# Patient Record
Sex: Female | Born: 1955 | Race: White | Hispanic: No | Marital: Single | Smoking: Never smoker
Health system: Southern US, Community
[De-identification: ages and names within clinical notes are randomized; demographics above are authoritative.]

## PROBLEM LIST (undated history)

## (undated) DIAGNOSIS — F79 Unspecified intellectual disabilities: Secondary | ICD-10-CM

## (undated) DIAGNOSIS — J1281 Pneumonia due to SARS-associated coronavirus: Secondary | ICD-10-CM

## (undated) DIAGNOSIS — I10 Essential (primary) hypertension: Secondary | ICD-10-CM

## (undated) DIAGNOSIS — E782 Mixed hyperlipidemia: Secondary | ICD-10-CM

## (undated) HISTORY — DX: Morbid (severe) obesity due to excess calories: E66.01

## (undated) HISTORY — DX: Pneumonia due to SARS-associated coronavirus: J12.81

## (undated) HISTORY — DX: Mixed hyperlipidemia: E78.2

## (undated) HISTORY — DX: Unspecified intellectual disabilities: F79

## (undated) HISTORY — DX: Essential (primary) hypertension: I10

---

## 2004-08-02 ENCOUNTER — Ambulatory Visit: Payer: Self-pay | Admitting: Family Medicine

## 2004-10-22 ENCOUNTER — Ambulatory Visit: Payer: Self-pay | Admitting: Family Medicine

## 2004-11-09 ENCOUNTER — Ambulatory Visit: Payer: Self-pay | Admitting: Family Medicine

## 2005-01-11 ENCOUNTER — Ambulatory Visit: Payer: Self-pay | Admitting: Family Medicine

## 2005-04-04 ENCOUNTER — Ambulatory Visit: Payer: Self-pay | Admitting: Family Medicine

## 2005-06-22 ENCOUNTER — Ambulatory Visit: Payer: Self-pay | Admitting: Family Medicine

## 2005-09-13 ENCOUNTER — Ambulatory Visit: Payer: Self-pay | Admitting: Family Medicine

## 2005-11-04 ENCOUNTER — Ambulatory Visit: Payer: Self-pay | Admitting: Family Medicine

## 2005-11-30 ENCOUNTER — Ambulatory Visit: Payer: Self-pay | Admitting: Family Medicine

## 2011-04-13 LAB — HM COLONOSCOPY

## 2011-11-23 DIAGNOSIS — H4011X0 Primary open-angle glaucoma, stage unspecified: Secondary | ICD-10-CM | POA: Diagnosis not present

## 2011-12-01 DIAGNOSIS — Z79899 Other long term (current) drug therapy: Secondary | ICD-10-CM | POA: Diagnosis not present

## 2011-12-01 DIAGNOSIS — I739 Peripheral vascular disease, unspecified: Secondary | ICD-10-CM | POA: Diagnosis not present

## 2011-12-01 DIAGNOSIS — I10 Essential (primary) hypertension: Secondary | ICD-10-CM | POA: Diagnosis not present

## 2011-12-01 DIAGNOSIS — Z1231 Encounter for screening mammogram for malignant neoplasm of breast: Secondary | ICD-10-CM | POA: Diagnosis not present

## 2011-12-21 DIAGNOSIS — I739 Peripheral vascular disease, unspecified: Secondary | ICD-10-CM | POA: Diagnosis not present

## 2012-01-10 DIAGNOSIS — Z0110 Encounter for examination of ears and hearing without abnormal findings: Secondary | ICD-10-CM | POA: Diagnosis not present

## 2012-01-16 DIAGNOSIS — Z1231 Encounter for screening mammogram for malignant neoplasm of breast: Secondary | ICD-10-CM | POA: Diagnosis not present

## 2012-05-30 DIAGNOSIS — H4011X0 Primary open-angle glaucoma, stage unspecified: Secondary | ICD-10-CM | POA: Diagnosis not present

## 2012-09-26 DIAGNOSIS — R4789 Other speech disturbances: Secondary | ICD-10-CM | POA: Diagnosis not present

## 2012-09-26 DIAGNOSIS — Z111 Encounter for screening for respiratory tuberculosis: Secondary | ICD-10-CM | POA: Diagnosis not present

## 2012-09-26 DIAGNOSIS — I10 Essential (primary) hypertension: Secondary | ICD-10-CM | POA: Diagnosis not present

## 2012-09-26 DIAGNOSIS — Z23 Encounter for immunization: Secondary | ICD-10-CM | POA: Diagnosis not present

## 2012-09-26 DIAGNOSIS — F71 Moderate intellectual disabilities: Secondary | ICD-10-CM | POA: Diagnosis not present

## 2012-11-16 DIAGNOSIS — F73 Profound intellectual disabilities: Secondary | ICD-10-CM | POA: Diagnosis not present

## 2012-11-16 DIAGNOSIS — IMO0001 Reserved for inherently not codable concepts without codable children: Secondary | ICD-10-CM | POA: Diagnosis not present

## 2012-11-16 DIAGNOSIS — F8089 Other developmental disorders of speech and language: Secondary | ICD-10-CM | POA: Diagnosis not present

## 2012-11-20 DIAGNOSIS — F8089 Other developmental disorders of speech and language: Secondary | ICD-10-CM | POA: Diagnosis not present

## 2012-11-20 DIAGNOSIS — F73 Profound intellectual disabilities: Secondary | ICD-10-CM | POA: Diagnosis not present

## 2012-11-20 DIAGNOSIS — IMO0001 Reserved for inherently not codable concepts without codable children: Secondary | ICD-10-CM | POA: Diagnosis not present

## 2012-12-04 DIAGNOSIS — I10 Essential (primary) hypertension: Secondary | ICD-10-CM | POA: Diagnosis not present

## 2012-12-04 DIAGNOSIS — Z0000 Encounter for general adult medical examination without abnormal findings: Secondary | ICD-10-CM | POA: Diagnosis not present

## 2013-01-16 DIAGNOSIS — Z1231 Encounter for screening mammogram for malignant neoplasm of breast: Secondary | ICD-10-CM | POA: Diagnosis not present

## 2013-06-20 DIAGNOSIS — Z23 Encounter for immunization: Secondary | ICD-10-CM | POA: Diagnosis not present

## 2013-06-20 DIAGNOSIS — I10 Essential (primary) hypertension: Secondary | ICD-10-CM | POA: Diagnosis not present

## 2013-06-20 DIAGNOSIS — F71 Moderate intellectual disabilities: Secondary | ICD-10-CM | POA: Diagnosis not present

## 2013-07-01 DIAGNOSIS — S9030XA Contusion of unspecified foot, initial encounter: Secondary | ICD-10-CM | POA: Diagnosis not present

## 2013-07-01 DIAGNOSIS — S93409A Sprain of unspecified ligament of unspecified ankle, initial encounter: Secondary | ICD-10-CM | POA: Diagnosis not present

## 2013-10-03 DIAGNOSIS — R635 Abnormal weight gain: Secondary | ICD-10-CM | POA: Diagnosis not present

## 2014-01-01 DIAGNOSIS — I10 Essential (primary) hypertension: Secondary | ICD-10-CM | POA: Diagnosis not present

## 2014-01-01 DIAGNOSIS — N3000 Acute cystitis without hematuria: Secondary | ICD-10-CM | POA: Diagnosis not present

## 2014-01-01 DIAGNOSIS — Z0000 Encounter for general adult medical examination without abnormal findings: Secondary | ICD-10-CM | POA: Diagnosis not present

## 2014-01-01 DIAGNOSIS — Z111 Encounter for screening for respiratory tuberculosis: Secondary | ICD-10-CM | POA: Diagnosis not present

## 2014-01-17 DIAGNOSIS — Z1231 Encounter for screening mammogram for malignant neoplasm of breast: Secondary | ICD-10-CM | POA: Diagnosis not present

## 2014-07-08 DIAGNOSIS — I10 Essential (primary) hypertension: Secondary | ICD-10-CM | POA: Diagnosis not present

## 2014-07-08 DIAGNOSIS — F71 Moderate intellectual disabilities: Secondary | ICD-10-CM | POA: Diagnosis not present

## 2014-07-08 DIAGNOSIS — Z23 Encounter for immunization: Secondary | ICD-10-CM | POA: Diagnosis not present

## 2014-11-03 DIAGNOSIS — B358 Other dermatophytoses: Secondary | ICD-10-CM | POA: Diagnosis not present

## 2015-01-21 DIAGNOSIS — N3000 Acute cystitis without hematuria: Secondary | ICD-10-CM | POA: Diagnosis not present

## 2015-01-21 DIAGNOSIS — Z0000 Encounter for general adult medical examination without abnormal findings: Secondary | ICD-10-CM | POA: Diagnosis not present

## 2015-01-21 DIAGNOSIS — I10 Essential (primary) hypertension: Secondary | ICD-10-CM | POA: Diagnosis not present

## 2015-02-03 DIAGNOSIS — L219 Seborrheic dermatitis, unspecified: Secondary | ICD-10-CM | POA: Diagnosis not present

## 2015-02-03 DIAGNOSIS — L639 Alopecia areata, unspecified: Secondary | ICD-10-CM | POA: Diagnosis not present

## 2015-02-11 DIAGNOSIS — Z1231 Encounter for screening mammogram for malignant neoplasm of breast: Secondary | ICD-10-CM | POA: Diagnosis not present

## 2015-03-17 DIAGNOSIS — L638 Other alopecia areata: Secondary | ICD-10-CM | POA: Diagnosis not present

## 2015-03-17 DIAGNOSIS — L299 Pruritus, unspecified: Secondary | ICD-10-CM | POA: Diagnosis not present

## 2015-04-28 DIAGNOSIS — L219 Seborrheic dermatitis, unspecified: Secondary | ICD-10-CM | POA: Diagnosis not present

## 2015-05-17 DIAGNOSIS — M84471A Pathological fracture, right ankle, initial encounter for fracture: Secondary | ICD-10-CM | POA: Diagnosis not present

## 2015-05-19 DIAGNOSIS — S8264XA Nondisplaced fracture of lateral malleolus of right fibula, initial encounter for closed fracture: Secondary | ICD-10-CM | POA: Diagnosis not present

## 2015-05-27 DIAGNOSIS — S8264XD Nondisplaced fracture of lateral malleolus of right fibula, subsequent encounter for closed fracture with routine healing: Secondary | ICD-10-CM | POA: Diagnosis not present

## 2015-06-03 DIAGNOSIS — S8264XD Nondisplaced fracture of lateral malleolus of right fibula, subsequent encounter for closed fracture with routine healing: Secondary | ICD-10-CM | POA: Diagnosis not present

## 2015-06-10 DIAGNOSIS — S8264XD Nondisplaced fracture of lateral malleolus of right fibula, subsequent encounter for closed fracture with routine healing: Secondary | ICD-10-CM | POA: Diagnosis not present

## 2015-07-01 DIAGNOSIS — S8264XD Nondisplaced fracture of lateral malleolus of right fibula, subsequent encounter for closed fracture with routine healing: Secondary | ICD-10-CM | POA: Diagnosis not present

## 2015-07-15 DIAGNOSIS — S8002XA Contusion of left knee, initial encounter: Secondary | ICD-10-CM | POA: Diagnosis not present

## 2015-07-15 DIAGNOSIS — S0031XA Abrasion of nose, initial encounter: Secondary | ICD-10-CM | POA: Diagnosis not present

## 2015-07-29 DIAGNOSIS — S8264XD Nondisplaced fracture of lateral malleolus of right fibula, subsequent encounter for closed fracture with routine healing: Secondary | ICD-10-CM | POA: Diagnosis not present

## 2015-09-23 DIAGNOSIS — F71 Moderate intellectual disabilities: Secondary | ICD-10-CM | POA: Diagnosis not present

## 2015-09-23 DIAGNOSIS — I10 Essential (primary) hypertension: Secondary | ICD-10-CM | POA: Diagnosis not present

## 2015-11-03 DIAGNOSIS — J060 Acute laryngopharyngitis: Secondary | ICD-10-CM | POA: Diagnosis not present

## 2015-12-23 DIAGNOSIS — L821 Other seborrheic keratosis: Secondary | ICD-10-CM | POA: Diagnosis not present

## 2016-01-18 DIAGNOSIS — Z6836 Body mass index (BMI) 36.0-36.9, adult: Secondary | ICD-10-CM | POA: Diagnosis not present

## 2016-01-18 DIAGNOSIS — Z0000 Encounter for general adult medical examination without abnormal findings: Secondary | ICD-10-CM | POA: Diagnosis not present

## 2016-02-12 DIAGNOSIS — Z1231 Encounter for screening mammogram for malignant neoplasm of breast: Secondary | ICD-10-CM | POA: Diagnosis not present

## 2016-03-29 DIAGNOSIS — I10 Essential (primary) hypertension: Secondary | ICD-10-CM | POA: Diagnosis not present

## 2016-03-29 DIAGNOSIS — F71 Moderate intellectual disabilities: Secondary | ICD-10-CM | POA: Diagnosis not present

## 2016-07-19 DIAGNOSIS — Z23 Encounter for immunization: Secondary | ICD-10-CM | POA: Diagnosis not present

## 2016-09-14 DIAGNOSIS — I10 Essential (primary) hypertension: Secondary | ICD-10-CM | POA: Diagnosis not present

## 2016-09-14 DIAGNOSIS — Z0000 Encounter for general adult medical examination without abnormal findings: Secondary | ICD-10-CM | POA: Diagnosis not present

## 2016-09-14 DIAGNOSIS — E782 Mixed hyperlipidemia: Secondary | ICD-10-CM | POA: Diagnosis not present

## 2016-09-14 DIAGNOSIS — Z6836 Body mass index (BMI) 36.0-36.9, adult: Secondary | ICD-10-CM | POA: Diagnosis not present

## 2016-09-14 DIAGNOSIS — Z111 Encounter for screening for respiratory tuberculosis: Secondary | ICD-10-CM | POA: Diagnosis not present

## 2017-03-27 DIAGNOSIS — Z1231 Encounter for screening mammogram for malignant neoplasm of breast: Secondary | ICD-10-CM | POA: Diagnosis not present

## 2017-03-27 DIAGNOSIS — F72 Severe intellectual disabilities: Secondary | ICD-10-CM | POA: Diagnosis not present

## 2017-03-27 DIAGNOSIS — E782 Mixed hyperlipidemia: Secondary | ICD-10-CM | POA: Diagnosis not present

## 2017-03-27 DIAGNOSIS — I10 Essential (primary) hypertension: Secondary | ICD-10-CM | POA: Diagnosis not present

## 2017-04-04 DIAGNOSIS — Z1231 Encounter for screening mammogram for malignant neoplasm of breast: Secondary | ICD-10-CM | POA: Diagnosis not present

## 2017-04-05 DIAGNOSIS — D692 Other nonthrombocytopenic purpura: Secondary | ICD-10-CM | POA: Diagnosis not present

## 2017-06-13 DIAGNOSIS — Z23 Encounter for immunization: Secondary | ICD-10-CM | POA: Diagnosis not present

## 2017-08-18 DIAGNOSIS — H2513 Age-related nuclear cataract, bilateral: Secondary | ICD-10-CM | POA: Diagnosis not present

## 2017-08-18 DIAGNOSIS — H40011 Open angle with borderline findings, low risk, right eye: Secondary | ICD-10-CM | POA: Diagnosis not present

## 2017-09-11 DIAGNOSIS — Z6841 Body Mass Index (BMI) 40.0 and over, adult: Secondary | ICD-10-CM | POA: Diagnosis not present

## 2017-09-11 DIAGNOSIS — Z0000 Encounter for general adult medical examination without abnormal findings: Secondary | ICD-10-CM | POA: Diagnosis not present

## 2017-09-27 DIAGNOSIS — E782 Mixed hyperlipidemia: Secondary | ICD-10-CM | POA: Diagnosis not present

## 2017-09-27 DIAGNOSIS — Z111 Encounter for screening for respiratory tuberculosis: Secondary | ICD-10-CM | POA: Diagnosis not present

## 2017-09-27 DIAGNOSIS — I10 Essential (primary) hypertension: Secondary | ICD-10-CM | POA: Diagnosis not present

## 2017-11-08 DIAGNOSIS — J00 Acute nasopharyngitis [common cold]: Secondary | ICD-10-CM | POA: Diagnosis not present

## 2018-01-09 DIAGNOSIS — R600 Localized edema: Secondary | ICD-10-CM | POA: Diagnosis not present

## 2018-01-09 DIAGNOSIS — S93402A Sprain of unspecified ligament of left ankle, initial encounter: Secondary | ICD-10-CM | POA: Diagnosis not present

## 2018-01-11 DIAGNOSIS — M5186 Other intervertebral disc disorders, lumbar region: Secondary | ICD-10-CM | POA: Diagnosis not present

## 2018-01-11 DIAGNOSIS — S3992XA Unspecified injury of lower back, initial encounter: Secondary | ICD-10-CM | POA: Diagnosis not present

## 2018-01-11 DIAGNOSIS — S93402A Sprain of unspecified ligament of left ankle, initial encounter: Secondary | ICD-10-CM | POA: Diagnosis not present

## 2018-01-11 DIAGNOSIS — M79601 Pain in right arm: Secondary | ICD-10-CM | POA: Diagnosis not present

## 2018-01-11 DIAGNOSIS — S99912A Unspecified injury of left ankle, initial encounter: Secondary | ICD-10-CM | POA: Diagnosis not present

## 2018-01-11 DIAGNOSIS — M7989 Other specified soft tissue disorders: Secondary | ICD-10-CM | POA: Diagnosis not present

## 2018-01-16 DIAGNOSIS — S93402A Sprain of unspecified ligament of left ankle, initial encounter: Secondary | ICD-10-CM | POA: Diagnosis not present

## 2018-03-27 DIAGNOSIS — I10 Essential (primary) hypertension: Secondary | ICD-10-CM | POA: Diagnosis not present

## 2018-03-27 DIAGNOSIS — Z1231 Encounter for screening mammogram for malignant neoplasm of breast: Secondary | ICD-10-CM | POA: Diagnosis not present

## 2018-03-27 DIAGNOSIS — E782 Mixed hyperlipidemia: Secondary | ICD-10-CM | POA: Diagnosis not present

## 2018-03-27 DIAGNOSIS — F72 Severe intellectual disabilities: Secondary | ICD-10-CM | POA: Diagnosis not present

## 2018-04-12 DIAGNOSIS — Z1231 Encounter for screening mammogram for malignant neoplasm of breast: Secondary | ICD-10-CM | POA: Diagnosis not present

## 2018-06-06 DIAGNOSIS — R635 Abnormal weight gain: Secondary | ICD-10-CM | POA: Diagnosis not present

## 2018-06-06 DIAGNOSIS — Z23 Encounter for immunization: Secondary | ICD-10-CM | POA: Diagnosis not present

## 2018-06-06 DIAGNOSIS — Z6838 Body mass index (BMI) 38.0-38.9, adult: Secondary | ICD-10-CM | POA: Diagnosis not present

## 2018-07-11 DIAGNOSIS — Z6838 Body mass index (BMI) 38.0-38.9, adult: Secondary | ICD-10-CM | POA: Diagnosis not present

## 2018-07-11 DIAGNOSIS — I10 Essential (primary) hypertension: Secondary | ICD-10-CM | POA: Diagnosis not present

## 2018-09-17 DIAGNOSIS — Z6838 Body mass index (BMI) 38.0-38.9, adult: Secondary | ICD-10-CM | POA: Diagnosis not present

## 2018-09-17 DIAGNOSIS — Z111 Encounter for screening for respiratory tuberculosis: Secondary | ICD-10-CM | POA: Diagnosis not present

## 2018-09-17 DIAGNOSIS — Z0000 Encounter for general adult medical examination without abnormal findings: Secondary | ICD-10-CM | POA: Diagnosis not present

## 2018-09-27 DIAGNOSIS — E782 Mixed hyperlipidemia: Secondary | ICD-10-CM | POA: Diagnosis not present

## 2018-09-27 DIAGNOSIS — Z111 Encounter for screening for respiratory tuberculosis: Secondary | ICD-10-CM | POA: Diagnosis not present

## 2018-09-27 DIAGNOSIS — I10 Essential (primary) hypertension: Secondary | ICD-10-CM | POA: Diagnosis not present

## 2019-06-10 DIAGNOSIS — R42 Dizziness and giddiness: Secondary | ICD-10-CM | POA: Diagnosis not present

## 2019-06-10 DIAGNOSIS — I10 Essential (primary) hypertension: Secondary | ICD-10-CM | POA: Diagnosis not present

## 2019-06-10 DIAGNOSIS — Z1231 Encounter for screening mammogram for malignant neoplasm of breast: Secondary | ICD-10-CM | POA: Diagnosis not present

## 2019-06-10 DIAGNOSIS — E782 Mixed hyperlipidemia: Secondary | ICD-10-CM | POA: Diagnosis not present

## 2019-06-11 ENCOUNTER — Inpatient Hospital Stay (HOSPITAL_COMMUNITY)
Admission: AD | Admit: 2019-06-11 | Discharge: 2019-06-19 | DRG: 177 | Payer: Medicare Other | Source: Other Acute Inpatient Hospital | Attending: Internal Medicine | Admitting: Internal Medicine

## 2019-06-11 DIAGNOSIS — E876 Hypokalemia: Secondary | ICD-10-CM | POA: Diagnosis not present

## 2019-06-11 DIAGNOSIS — R0602 Shortness of breath: Secondary | ICD-10-CM | POA: Diagnosis not present

## 2019-06-11 DIAGNOSIS — M25551 Pain in right hip: Secondary | ICD-10-CM | POA: Diagnosis not present

## 2019-06-11 DIAGNOSIS — R531 Weakness: Secondary | ICD-10-CM | POA: Diagnosis not present

## 2019-06-11 DIAGNOSIS — U071 COVID-19: Secondary | ICD-10-CM | POA: Diagnosis not present

## 2019-06-11 DIAGNOSIS — E7800 Pure hypercholesterolemia, unspecified: Secondary | ICD-10-CM | POA: Diagnosis not present

## 2019-06-11 DIAGNOSIS — R7301 Impaired fasting glucose: Secondary | ICD-10-CM | POA: Diagnosis not present

## 2019-06-11 DIAGNOSIS — E785 Hyperlipidemia, unspecified: Secondary | ICD-10-CM | POA: Diagnosis present

## 2019-06-11 DIAGNOSIS — I10 Essential (primary) hypertension: Secondary | ICD-10-CM | POA: Diagnosis not present

## 2019-06-11 DIAGNOSIS — J181 Lobar pneumonia, unspecified organism: Secondary | ICD-10-CM | POA: Diagnosis not present

## 2019-06-11 DIAGNOSIS — W19XXXA Unspecified fall, initial encounter: Secondary | ICD-10-CM | POA: Diagnosis not present

## 2019-06-11 DIAGNOSIS — F79 Unspecified intellectual disabilities: Secondary | ICD-10-CM | POA: Diagnosis not present

## 2019-06-11 DIAGNOSIS — J1289 Other viral pneumonia: Secondary | ICD-10-CM | POA: Diagnosis not present

## 2019-06-11 DIAGNOSIS — I2510 Atherosclerotic heart disease of native coronary artery without angina pectoris: Secondary | ICD-10-CM | POA: Diagnosis not present

## 2019-06-11 DIAGNOSIS — J1282 Pneumonia due to coronavirus disease 2019: Secondary | ICD-10-CM

## 2019-06-11 DIAGNOSIS — M47819 Spondylosis without myelopathy or radiculopathy, site unspecified: Secondary | ICD-10-CM | POA: Diagnosis not present

## 2019-06-11 DIAGNOSIS — I700 Atherosclerosis of aorta: Secondary | ICD-10-CM | POA: Diagnosis not present

## 2019-06-11 DIAGNOSIS — S79911A Unspecified injury of right hip, initial encounter: Secondary | ICD-10-CM | POA: Diagnosis not present

## 2019-06-11 DIAGNOSIS — R918 Other nonspecific abnormal finding of lung field: Secondary | ICD-10-CM | POA: Diagnosis not present

## 2019-06-11 DIAGNOSIS — F73 Profound intellectual disabilities: Secondary | ICD-10-CM | POA: Diagnosis not present

## 2019-06-11 MED ORDER — ONDANSETRON HCL 4 MG PO TABS: 4.0000 mg | ORAL_TABLET | Freq: Four times a day (QID) | ORAL | Status: DC | PRN

## 2019-06-11 MED ORDER — SODIUM CHLORIDE 0.9% FLUSH
3.0000 mL | Freq: Two times a day (BID) | INTRAVENOUS | Status: DC
  Administered 2019-06-11 – 2019-06-18 (×14): 3 mL via INTRAVENOUS

## 2019-06-11 MED ORDER — VITAMIN C 500 MG PO TABS
500.0000 mg | ORAL_TABLET | Freq: Every day | ORAL | Status: DC
  Administered 2019-06-12 – 2019-06-19 (×8): 500 mg via ORAL
  Filled 2019-06-11 (×8): qty 1

## 2019-06-11 MED ORDER — ENOXAPARIN SODIUM 40 MG/0.4ML ~~LOC~~ SOLN
40.0000 mg | Freq: Every day | SUBCUTANEOUS | Status: DC
  Administered 2019-06-11 – 2019-06-18 (×8): 40 mg via SUBCUTANEOUS
  Filled 2019-06-11 (×8): qty 0.4

## 2019-06-11 MED ORDER — SODIUM CHLORIDE 0.9 % IV SOLN
250.0000 mL | INTRAVENOUS | Status: DC | PRN
  Administered 2019-06-11: 250 mL via INTRAVENOUS

## 2019-06-11 MED ORDER — ONDANSETRON HCL 4 MG/2ML IJ SOLN: 4.0000 mg | Freq: Four times a day (QID) | INTRAMUSCULAR | Status: DC | PRN

## 2019-06-11 MED ORDER — ACETAMINOPHEN 325 MG PO TABS: 650.0000 mg | ORAL_TABLET | Freq: Four times a day (QID) | ORAL | Status: DC | PRN

## 2019-06-11 MED ORDER — ZINC SULFATE 220 (50 ZN) MG PO CAPS
220.0000 mg | ORAL_CAPSULE | Freq: Every day | ORAL | Status: DC
  Administered 2019-06-12 – 2019-06-19 (×8): 220 mg via ORAL
  Filled 2019-06-11 (×9): qty 1

## 2019-06-11 MED ORDER — SODIUM CHLORIDE 0.9% FLUSH: 3.0000 mL | INTRAVENOUS | Status: DC | PRN

## 2019-06-11 MED ORDER — DEXAMETHASONE SODIUM PHOSPHATE 10 MG/ML IJ SOLN
6.0000 mg | INTRAMUSCULAR | Status: DC
  Administered 2019-06-12 – 2019-06-15 (×4): 6 mg via INTRAVENOUS
  Filled 2019-06-11 (×5): qty 1
  Filled 2019-06-11: qty 0.6

## 2019-06-11 MED ORDER — HYDROCOD POLST-CPM POLST ER 10-8 MG/5ML PO SUER: 5.0000 mL | Freq: Two times a day (BID) | ORAL | Status: DC | PRN

## 2019-06-11 MED ORDER — GUAIFENESIN-DM 100-10 MG/5ML PO SYRP
10.0000 mL | ORAL_SOLUTION | ORAL | Status: DC | PRN
  Administered 2019-06-16: 10 mL via ORAL
  Filled 2019-06-11: qty 10

## 2019-06-11 NOTE — H&P (Addendum)
History and Physical    [name]:[id number] DOB: 1956/02/14 DOA: 06/11/2019  PCP: Darrol Jump, PA-C  Patient coming from: snf  Chief Complaint:  Weak, sob  HPI: Lisa Montoya is a 63 y.o. female with medical history significant of mental retardation, htn lives in snf comes in with 2 days of weakness, and dec po intake.  Pt is nonverbal but ambulatory at baseline.  Cannot provide history.  94% on RA.  Vss.  Mildly orthostatic, given ivf and decadron.  Has bilatearl infiltrates on cxr.    Review of Systems:  unobtainable  No past medical history on file. htn ,  intectual disability  Unobtainable histories due to above.     has no history on file for tobacco, alcohol, and drug.  Not on File  No family history on file.  Unknown   Prior to Admission medications   Not on File  unknown  Physical Exam: Vitals:   06/11/19 2300 06/11/19 2311  BP:  118/72  Pulse:  78  Resp:  14  Temp:  98.3 F (36.8 C)  TempSrc:  Oral  SpO2:  92%  Weight: 89.3 kg       Constitutional: NAD, calm, comfortable, nonverbal Vitals:   06/11/19 2300 06/11/19 2311  BP:  118/72  Pulse:  78  Resp:  14  Temp:  98.3 F (36.8 C)  TempSrc:  Oral  SpO2:  92%  Weight: 89.3 kg    Eyes: PERRL, lids and conjunctivae normal ENMT: Mucous membranes are moist. Posterior pharynx clear of any exudate or lesions.Normal dentition.  Neck: normal, supple, no masses, no thyromegaly Respiratory: clear to auscultation bilaterally, no wheezing, no crackles. Normal respiratory effort. No accessory muscle use.  Cardiovascular: Regular rate and rhythm, no murmurs / rubs / gallops. No extremity edema. 2+ pedal pulses. No carotid bruits.  Abdomen: no tenderness, no masses palpated. No hepatosplenomegaly. Bowel sounds positive.  Musculoskeletal: no clubbing / cyanosis. No joint deformity upper and lower extremities. Good ROM, no contractures. Normal muscle tone.  Skin: no rashes, lesions, ulcers. No  induration Neurologic: CN 2-12 grossly intact. Sensation intact, DTR normal. Strength 5/5 in all 4.  Psychiatric:  Normal mood.    Labs on Admission: I have personally reviewed following labs and imaging studies  CBC: No results for input(s): WBC, NEUTROABS, HGB, HCT, MCV, PLT in the last 168 hours. Basic Metabolic Panel: No results for input(s): NA, K, CL, CO2, GLUCOSE, BUN, CREATININE, CALCIUM, MG, PHOS in the last 168 hours. GFR: CrCl cannot be calculated (No successful lab value found.). Liver Function Tests: No results for input(s): AST, ALT, ALKPHOS, BILITOT, PROT, ALBUMIN in the last 168 hours. No results for input(s): LIPASE, AMYLASE in the last 168 hours. No results for input(s): AMMONIA in the last 168 hours. Coagulation Profile: No results for input(s): INR, PROTIME in the last 168 hours. Cardiac Enzymes: No results for input(s): CKTOTAL, CKMB, CKMBINDEX, TROPONINI in the last 168 hours. BNP (last 3 results) No results for input(s): PROBNP in the last 8760 hours. HbA1C: No results for input(s): HGBA1C in the last 72 hours. CBG: No results for input(s): GLUCAP in the last 168 hours. Lipid Profile: No results for input(s): CHOL, HDL, LDLCALC, TRIG, CHOLHDL, LDLDIRECT in the last 72 hours. Thyroid Function Tests: No results for input(s): TSH, T4TOTAL, FREET4, T3FREE, THYROIDAB in the last 72 hours. Anemia Panel: No results for input(s): VITAMINB12, FOLATE, FERRITIN, TIBC, IRON, RETICCTPCT in the last 72 hours. Urine analysis: No results found for:  COLORURINE, APPEARANCEUR, LABSPEC, PHURINE, GLUCOSEU, HGBUR, BILIRUBINUR, KETONESUR, PROTEINUR, UROBILINOGEN, NITRITE, LEUKOCYTESUR Sepsis Labs: !!!!!!!!!!!!!!!!!!!!!!!!!!!!!!!!!!!!!!!!!!!! @LABRCNTIP (procalcitonin:4,lacticidven:4) )No results found for this or any previous visit (from the past 240 hour(s)).   Radiological Exams on Admission: No results found.  Chart from [facility] reviewed   Assessment/Plan 63 yo female  with covid pna  Principal Problem:   Pneumonia due to COVID-19 virus- decadron 6mg  iv daily, remdesivir per pharmacy.  Lovenox/vit c/zinc.  Supplemental oxygen as needed.  Bilateral infiltrates on cxr.    Active Problems:   Intellectual disability- stable    HTN (hypertension)- clarify home meds     DVT prophylaxis: lovenox Code Status:  full Family Communication: none  Disposition Plan:  days Consults called:  none Admission status:  admission   Enslee Bibbins A MD [facility]  If 7PM-7AM, please contact night-coverage [email or web address] Password [email or web address]  06/12/2019, 12:02 AM    Pt seen before midnight  Also at [facility] ED visit:  afebrile. Saturations were 94% on room air. Blood pressure was slightly low at 106/54. She was noted to be orthostatic. Heart rate 74. Afebrile.  WBC 7.2. Hemoglobin 14.8. Platelet count 129. Sodium 134 potassium 3.3 chloride 94 bicarb 34 BUN 17 creatinine 0.9 glucose 126. LFTs include AST of 69 and ALT of 32  COVID-19 test was positive. UA negative. Chest x-ray suggested a mass and so a CT scan of the chest was done which did not show any mass but showed bilateral opacities.  CRP d-dimer ferritin lactic acid and procalcitonin levels are pending.

## 2019-06-12 ENCOUNTER — Inpatient Hospital Stay (HOSPITAL_COMMUNITY): Payer: Medicare Other

## 2019-06-12 DIAGNOSIS — I10 Essential (primary) hypertension: Secondary | ICD-10-CM

## 2019-06-12 DIAGNOSIS — F79 Unspecified intellectual disabilities: Secondary | ICD-10-CM

## 2019-06-12 LAB — CBC WITH DIFFERENTIAL/PLATELET
Abs Immature Granulocytes: 0.02 10*3/uL (ref 0.00–0.07)
Basophils Absolute: 0 10*3/uL (ref 0.0–0.1)
Basophils Relative: 0 %
Eosinophils Absolute: 0 10*3/uL (ref 0.0–0.5)
Eosinophils Relative: 0 %
HCT: 41.2 % (ref 36.0–46.0)
Hemoglobin: 13.7 g/dL (ref 12.0–15.0)
Immature Granulocytes: 0 %
Lymphocytes Relative: 14 %
Lymphs Abs: 0.9 10*3/uL (ref 0.7–4.0)
MCH: 31.4 pg (ref 26.0–34.0)
MCHC: 33.3 g/dL (ref 30.0–36.0)
MCV: 94.3 fL (ref 80.0–100.0)
Monocytes Absolute: 0.2 10*3/uL (ref 0.1–1.0)
Monocytes Relative: 3 %
Neutro Abs: 5.5 10*3/uL (ref 1.7–7.7)
Neutrophils Relative %: 83 %
Platelets: 145 10*3/uL — ABNORMAL LOW (ref 150–400)
RBC: 4.37 MIL/uL (ref 3.87–5.11)
RDW: 13 % (ref 11.5–15.5)
WBC: 6.6 10*3/uL (ref 4.0–10.5)
nRBC: 0 % (ref 0.0–0.2)

## 2019-06-12 LAB — COMPREHENSIVE METABOLIC PANEL
ALT: 29 U/L (ref 0–44)
AST: 50 U/L — ABNORMAL HIGH (ref 15–41)
Albumin: 3.2 g/dL — ABNORMAL LOW (ref 3.5–5.0)
Alkaline Phosphatase: 42 U/L (ref 38–126)
Anion gap: 16 — ABNORMAL HIGH (ref 5–15)
BUN: 14 mg/dL (ref 8–23)
CO2: 25 mmol/L (ref 22–32)
Calcium: 8.4 mg/dL — ABNORMAL LOW (ref 8.9–10.3)
Chloride: 100 mmol/L (ref 98–111)
Creatinine, Ser: 0.53 mg/dL (ref 0.44–1.00)
GFR calc Af Amer: >60 mL/min (ref >60)
GFR calc non Af Amer: >60 mL/min (ref >60)
Glucose, Bld: 132 mg/dL — ABNORMAL HIGH (ref 70–99)
Potassium: 3.3 mmol/L — ABNORMAL LOW (ref 3.5–5.1)
Sodium: 141 mmol/L (ref 135–145)
Total Bilirubin: 1 mg/dL (ref 0.3–1.2)
Total Protein: 6.6 g/dL (ref 6.5–8.1)

## 2019-06-12 LAB — D-DIMER, QUANTITATIVE: D-Dimer, Quant: 1.42 ug/mL-FEU — ABNORMAL HIGH (ref 0.00–0.50)

## 2019-06-12 LAB — ABO/RH: ABO/RH(D): A POS

## 2019-06-12 LAB — C-REACTIVE PROTEIN: CRP: 8.2 mg/dL — ABNORMAL HIGH (ref <1.0)

## 2019-06-12 LAB — HIV ANTIBODY (ROUTINE TESTING W REFLEX): HIV Screen 4th Generation wRfx: NONREACTIVE

## 2019-06-12 MED ORDER — ATORVASTATIN CALCIUM 10 MG PO TABS
10.0000 mg | ORAL_TABLET | Freq: Every day | ORAL | Status: DC
  Administered 2019-06-12 – 2019-06-18 (×7): 10 mg via ORAL
  Filled 2019-06-12 (×7): qty 1

## 2019-06-12 MED ORDER — SODIUM CHLORIDE 0.9 % IV SOLN
100.0000 mg | INTRAVENOUS | Status: AC
  Administered 2019-06-12 – 2019-06-14 (×3): 100 mg via INTRAVENOUS
  Filled 2019-06-12 (×3): qty 20

## 2019-06-12 MED ORDER — BISOPROLOL FUMARATE 5 MG PO TABS
5.0000 mg | ORAL_TABLET | Freq: Every day | ORAL | Status: DC
  Administered 2019-06-12 – 2019-06-19 (×8): 5 mg via ORAL
  Filled 2019-06-12 (×8): qty 1

## 2019-06-12 MED ORDER — ASPIRIN EC 81 MG PO TBEC
81.0000 mg | DELAYED_RELEASE_TABLET | Freq: Every day | ORAL | Status: DC
  Administered 2019-06-12 – 2019-06-19 (×8): 81 mg via ORAL
  Filled 2019-06-12 (×9): qty 1

## 2019-06-12 MED ORDER — POLYVINYL ALCOHOL 1.4 % OP SOLN
1.0000 [drp] | OPHTHALMIC | Status: DC | PRN
  Filled 2019-06-12: qty 15

## 2019-06-12 MED ORDER — TIMOLOL MALEATE 0.5 % OP SOLN
1.0000 [drp] | Freq: Two times a day (BID) | OPHTHALMIC | Status: DC
  Administered 2019-06-12: 2 [drp] via OPHTHALMIC
  Administered 2019-06-13 (×2): 1 [drp] via OPHTHALMIC
  Administered 2019-06-14: 2 [drp] via OPHTHALMIC
  Administered 2019-06-14 – 2019-06-19 (×10): 1 [drp] via OPHTHALMIC
  Filled 2019-06-12: qty 5

## 2019-06-12 MED ORDER — SODIUM CHLORIDE 0.9 % IV SOLN
200.0000 mg | Freq: Once | INTRAVENOUS | Status: AC
  Administered 2019-06-12: 200 mg via INTRAVENOUS
  Filled 2019-06-12: qty 40

## 2019-06-12 MED ORDER — TIMOLOL MALEATE 0.5 % OP SOLN
1.0000 [drp] | Freq: Two times a day (BID) | OPHTHALMIC | Status: DC
  Filled 2019-06-12: qty 5

## 2019-06-12 NOTE — Plan of Care (Signed)
Report was called by Doug Sou from [facility]. The pt has had generalized weakness at the group home but didn't have symptoms of Covid19. Ddimer was elevated so Chest CT was done which showed atypical pneumonia. The pt lives in a group home because she has Mental Retardation. The pt is nonverbal at baseline.

## 2019-06-12 NOTE — Progress Notes (Signed)
Updated patient's group home on patient's condition and plan of care.  Notified by admissions that patient's legal guardian is [place], however there was no answer at the number they provided. Will continue to reach out to legal guardian with updates.

## 2019-06-12 NOTE — Plan of Care (Signed)

## 2019-06-12 NOTE — Progress Notes (Signed)
Pharmacy Brief Note   O:  ALT: 32 ( at [facility])   CXR: Has bilateral infiltrates on cxr per MD note   SpO2: 94% on RA.   A/P:  Patient meets requirements for remdesivir therapy .  Will start  remdesivir 200 mg IV x 1  followed by 100 mg IV daily x 4 days.  Monitor ALT  Royetta Asal, PharmD, BCPS 06/12/2019 12:28 AM

## 2019-06-12 NOTE — Progress Notes (Signed)
PROGRESS NOTE                                                                                                                                                                                                             Patient Demographics:    Lisa Montoya, is a 63 y.o. female, DOB - 02-06-1956, HER:[id number]  Outpatient Primary MD for the patient is Gus Height, PA-C   Admit date - 06/11/2019   LOS - 1  No chief complaint on file.      Brief Narrative: Brief Narrative: Patient is a 63 y.o. female with history of mental retardation, HTN-presented to [facility] for weakness and decreased p.o. intake-further work-up revealed pneumonia secondary to COVID-19.    Subjective:   Subjective: Nonverbal at baseline-but follows commands.  Tracks movement.   Assessment  & Plan :    Covid 19 Viral pneumonia: Remains stable on room air-continue steroids and Remdesivir.  Follow inflammatory markers.  Fever: afebrile/  O2 requirements: On room air  COVID-19 Labs: Recent Labs    06/12/19 0430  DDIMER 1.42*  CRP 8.2*    No results found for: SARSCOV2NAA   COVID-19 Medications: Steroids:10/6>> Remdesivir:10/7>> Actemra:Not indicated Convalescent Plasma:Not indicated Research Studies:N/A  Other medications: Diuretics:Euvolemic-no need for lasix Antibiotics:Not needed as no evidence of bacterial infection  DVT Prophylaxis  :  Lovenox   Hypokalemia: Replete and recheck  HTN: Blood pressure creeping up-resume bisoprolol-continue to hold HCTZ  Dyslipidemia: Continue statin  Mental retardation: Awaiting callback from Lynwood Dawley in facesheet-to see what her usual baseline is.  But per RN-she is nonverbal at baseline.  ABG: No results found for: PHART, PCO2ART, PO2ART, HCO3, TCO2, ACIDBASEDEF, O2SAT  Vent Settings: N/A   Condition -Stabl  Family communication  : Left message for  Delfino Lovett  Code Status :  Full Code  Diet :  Diet Order            Diet Heart Room service appropriate? Yes; Fluid consistency: Thin  Diet effective now               Disposition Plan  :  Remain hospitalized  Barriers to discharge: Needs to complete 5 days of IV Remdesivir  Consults  :  None  Procedures  :  None  Antibiotics  :    Anti-infectives (From admission, onward)  Start     Dose/Rate Route Frequency Ordered Stop   06/12/19 1600  remdesivir 100 mg in sodium chloride 0.9 % 250 mL IVPB     100 mg 500 mL/hr over 30 Minutes Intravenous Every 24 hours 06/12/19 0053 06/16/19 1559   06/12/19 0130  remdesivir 200 mg in sodium chloride 0.9 % 250 mL IVPB     200 mg 500 mL/hr over 30 Minutes Intravenous Once 06/12/19 0053 06/12/19 0215      Inpatient Medications  Scheduled Meds: . dexamethasone (DECADRON) injection  6 mg Intravenous Q24H  . enoxaparin (LOVENOX) injection  40 mg Subcutaneous QHS  . sodium chloride flush  3 mL Intravenous Q12H  . vitamin C  500 mg Oral Daily  . zinc sulfate  220 mg Oral Daily   Continuous Infusions: . sodium chloride 250 mL (06/11/19 2347)  . remdesivir 100 mg in NS 250 mL     PRN Meds:.sodium chloride, acetaminophen, chlorpheniramine-HYDROcodone, guaiFENesin-dextromethorphan, ondansetron **OR** ondansetron (ZOFRAN) IV, sodium chloride flush   Time Spent in minutes  25  See all Orders from today for further details   Oren Binet M.D on 06/12/2019 at 1:56 PM  To page go to [email or web address] - use universal password  [facility] -  Office  [phone]    Objective:   Vitals:   06/11/19 2311 06/11/19 2351 06/12/19 0409 06/12/19 0745  BP: 118/72 118/66 125/68 (!) 130/57  Pulse: 78 78 83 94  Resp: 14 13 (!) 24 16  Temp: 98.3 F (36.8 C)  97.7 F (36.5 C) 98.4 F (36.9 C)  TempSrc: Oral  Oral Oral  SpO2: 92% 94% 94% 97%  Weight:        Wt Readings from Last 3 Encounters:  06/11/19 89.3 kg      Intake/Output Summary (Last 24 hours) at 06/12/2019 1356 Last data filed at 06/12/2019 0700 Gross per 24 hour  Intake 500 ml  Output 100 ml  Net 400 ml     Physical Exam Gen Exam:Alert awake-not in any distress HEENT:atraumatic, normocephalic Chest: B/L clear to auscultation anteriorly CVS:S1S2 regular Abdomen:soft non tender, non distended Extremities:no edema Neurology: Non focal Skin: no rash   Data Review:    CBC Recent Labs  Lab 06/12/19 0430  WBC 6.6  HGB 13.7  HCT 41.2  PLT 145*  MCV 94.3  MCH 31.4  MCHC 33.3  RDW 13.0  LYMPHSABS 0.9  MONOABS 0.2  EOSABS 0.0  BASOSABS 0.0    Chemistries  Recent Labs  Lab 06/12/19 0430  NA 141  K 3.3*  CL 100  CO2 25  GLUCOSE 132*  BUN 14  CREATININE 0.53  CALCIUM 8.4*  AST 50*  ALT 29  ALKPHOS 42  BILITOT 1.0   ------------------------------------------------------------------------------------------------------------------ No results for input(s): CHOL, HDL, LDLCALC, TRIG, CHOLHDL, LDLDIRECT in the last 72 hours.  No results found for: HGBA1C ------------------------------------------------------------------------------------------------------------------ No results for input(s): TSH, T4TOTAL, T3FREE, THYROIDAB in the last 72 hours.  Invalid input(s): FREET3 ------------------------------------------------------------------------------------------------------------------ No results for input(s): VITAMINB12, FOLATE, FERRITIN, TIBC, IRON, RETICCTPCT in the last 72 hours.  Coagulation profile No results for input(s): INR, PROTIME in the last 168 hours.  Recent Labs    06/12/19 0430  DDIMER 1.42*    Cardiac Enzymes No results for input(s): CKMB, TROPONINI, MYOGLOBIN in the last 168 hours.  Invalid input(s): CK ------------------------------------------------------------------------------------------------------------------ No results found for: BNP  Micro Results No results found for this or any  previous visit (from the past 240 hour(s)).  Radiology Reports Dg  Chest Port 1v Same Day  Result Date: 06/12/2019 CLINICAL DATA:  Shortness of breath. EXAM: PORTABLE CHEST 1 VIEW COMPARISON:  June 11, 2019. FINDINGS: Stable cardiomediastinal silhouette. No pneumothorax or pleural effusion is noted. Mild bilateral perihilar opacities are noted concerning for multifocal pneumonia as described on prior CT scan. Bony thorax is unremarkable. IMPRESSION: Bilateral perihilar opacities are noted concerning for multifocal pneumonia as described on prior CT scan. Electronically Signed   By: Lupita RaiderJames  Green Jr M.D.   On: 06/12/2019 10:27

## 2019-06-13 ENCOUNTER — Encounter (HOSPITAL_COMMUNITY): Payer: Self-pay | Admitting: Emergency Medicine

## 2019-06-13 ENCOUNTER — Other Ambulatory Visit: Payer: Self-pay

## 2019-06-13 LAB — CBC WITH DIFFERENTIAL/PLATELET
Abs Immature Granulocytes: 0.07 10*3/uL (ref 0.00–0.07)
Basophils Absolute: 0 10*3/uL (ref 0.0–0.1)
Basophils Relative: 0 %
Eosinophils Absolute: 0 10*3/uL (ref 0.0–0.5)
Eosinophils Relative: 0 %
HCT: 38.1 % (ref 36.0–46.0)
Hemoglobin: 12.7 g/dL (ref 12.0–15.0)
Immature Granulocytes: 1 %
Lymphocytes Relative: 12 %
Lymphs Abs: 0.9 10*3/uL (ref 0.7–4.0)
MCH: 31.3 pg (ref 26.0–34.0)
MCHC: 33.3 g/dL (ref 30.0–36.0)
MCV: 93.8 fL (ref 80.0–100.0)
Monocytes Absolute: 0.6 10*3/uL (ref 0.1–1.0)
Monocytes Relative: 8 %
Neutro Abs: 5.5 10*3/uL (ref 1.7–7.7)
Neutrophils Relative %: 79 %
Platelets: 166 10*3/uL (ref 150–400)
RBC: 4.06 MIL/uL (ref 3.87–5.11)
RDW: 12.9 % (ref 11.5–15.5)
WBC: 7 10*3/uL (ref 4.0–10.5)
nRBC: 0 % (ref 0.0–0.2)

## 2019-06-13 LAB — COMPREHENSIVE METABOLIC PANEL
ALT: 28 U/L (ref 0–44)
AST: 52 U/L — ABNORMAL HIGH (ref 15–41)
Albumin: 3 g/dL — ABNORMAL LOW (ref 3.5–5.0)
Alkaline Phosphatase: 39 U/L (ref 38–126)
Anion gap: 14 (ref 5–15)
BUN: 19 mg/dL (ref 8–23)
CO2: 25 mmol/L (ref 22–32)
Calcium: 8 mg/dL — ABNORMAL LOW (ref 8.9–10.3)
Chloride: 101 mmol/L (ref 98–111)
Creatinine, Ser: 0.62 mg/dL (ref 0.44–1.00)
GFR calc Af Amer: >60 mL/min (ref >60)
GFR calc non Af Amer: >60 mL/min (ref >60)
Glucose, Bld: 114 mg/dL — ABNORMAL HIGH (ref 70–99)
Potassium: 3.4 mmol/L — ABNORMAL LOW (ref 3.5–5.1)
Sodium: 140 mmol/L (ref 135–145)
Total Bilirubin: 0.6 mg/dL (ref 0.3–1.2)
Total Protein: 6.1 g/dL — ABNORMAL LOW (ref 6.5–8.1)

## 2019-06-13 LAB — FERRITIN: Ferritin: 529 ng/mL — ABNORMAL HIGH (ref 11–307)

## 2019-06-13 LAB — C-REACTIVE PROTEIN: CRP: 6.2 mg/dL — ABNORMAL HIGH (ref <1.0)

## 2019-06-13 LAB — D-DIMER, QUANTITATIVE: D-Dimer, Quant: 1.05 ug/mL-FEU — ABNORMAL HIGH (ref 0.00–0.50)

## 2019-06-13 MED ORDER — POTASSIUM CHLORIDE CRYS ER 20 MEQ PO TBCR
40.0000 meq | EXTENDED_RELEASE_TABLET | Freq: Once | ORAL | Status: AC
  Administered 2019-06-13: 40 meq via ORAL
  Filled 2019-06-13: qty 2

## 2019-06-13 NOTE — Progress Notes (Signed)
PROGRESS NOTE                                                                                                                                                                                                             Patient Demographics:    Lisa Montoya, is a 63 y.o. female, DOB - [birth year], PCH:[id number]  Outpatient Primary MD for the patient is Darrol Jump, PA-C   Admit date - 06/11/2019   LOS - 2  No chief complaint on file.      Brief Narrative: Patient is a 63 y.o. female with history of mental retardation, HTN-presented to [facility] for weakness and decreased p.o. intake-further work-up revealed pneumonia secondary to COVID-19.    Subjective:   Lying comfortably in bed-no major events overnight per RN.  She is at her baseline.  She is nonverbal-but follows commands.   Assessment  & Plan :    Covid 19 Viral pneumonia: Remains stable-she is on room air-continue steroids and Remdesivir.  CRP is slowly downtrending-follow closely.  Fever: afebrile  O2 requirements: On room air  COVID-19 Labs: Recent Labs    06/12/19 0430 06/13/19 0207  DDIMER 1.42* 1.05*  FERRITIN  --  529*  CRP 8.2* 6.2*    No results found for: SARSCOV2NAA   COVID-19 Medications: Steroids:10/6>> Remdesivir:10/7>> Actemra:Not indicated Convalescent Plasma:Not indicated Research Studies:N/A  Other medications: Diuretics:Euvolemic-no need for lasix Antibiotics:Not needed as no evidence of bacterial infection  DVT Prophylaxis  :  Lovenox   Hypokalemia: Replete and recheck.  HTN: BP controlled-continue bisoprolol-continue to hold HCTZ.    Dyslipidemia: Continue statin  Mental retardation: Spoke with legal Marlowe Alt on 10/8-at baseline patient is nonverbal-but is able to walk on her own.  She is able to follow commands as well.  ABG: No results found for: PHART, PCO2ART, PO2ART, HCO3, TCO2,  ACIDBASEDEF, O2SAT  Vent Settings: N/A   Condition -Stabl  Family communication  : Spoke with Meyer Russel [phone] is the patient's legal guardian.  Code Status :  Full Code  Diet :  Diet Order            Diet Heart Room service appropriate? Yes; Fluid consistency: Thin  Diet effective now               Disposition Plan  :  Remain hospitalized  Barriers to  discharge: Needs to complete 5 days of IV Remdesivir  Consults  :  None  Procedures  :  None  Antibiotics  :    Anti-infectives (From admission, onward)   Start     Dose/Rate Route Frequency Ordered Stop   06/12/19 1600  remdesivir 100 mg in sodium chloride 0.9 % 250 mL IVPB     100 mg 500 mL/hr over 30 Minutes Intravenous Every 24 hours 06/12/19 0053 06/16/19 1559   06/12/19 0130  remdesivir 200 mg in sodium chloride 0.9 % 250 mL IVPB     200 mg 500 mL/hr over 30 Minutes Intravenous Once 06/12/19 0053 06/12/19 0215      Inpatient Medications  Scheduled Meds: . aspirin EC  81 mg Oral Daily  . atorvastatin  10 mg Oral q1800  . bisoprolol  5 mg Oral Daily  . dexamethasone (DECADRON) injection  6 mg Intravenous Q24H  . enoxaparin (LOVENOX) injection  40 mg Subcutaneous QHS  . sodium chloride flush  3 mL Intravenous Q12H  . timolol  1-2 drop Both Eyes BID  . vitamin C  500 mg Oral Daily  . zinc sulfate  220 mg Oral Daily   Continuous Infusions: . sodium chloride 250 mL (06/11/19 2347)  . remdesivir 100 mg in NS 250 mL 100 mg (06/12/19 1630)   PRN Meds:.sodium chloride, acetaminophen, chlorpheniramine-HYDROcodone, guaiFENesin-dextromethorphan, ondansetron **OR** ondansetron (ZOFRAN) IV, polyvinyl alcohol, sodium chloride flush   Time Spent in minutes  25  See all Orders from today for further details   Jeoffrey Massed M.D on 06/13/2019 at 10:55 AM  To page go to [email or web address] - use universal password  [facility] -  Office  [phone]    Objective:   Vitals:   06/12/19 1920  06/12/19 1944 06/13/19 0334 06/13/19 0750  BP: (!) 141/74  (!) 117/57 (!) 113/59  Pulse: 87  67 72  Resp: 20   16  Temp: 98 F (36.7 C)  (!) 97.4 F (36.3 C) (!) 97.5 F (36.4 C)  TempSrc: Oral  Oral Oral  SpO2: 100% 97% 91% 94%  Weight:        Wt Readings from Last 3 Encounters:  06/11/19 89.3 kg     Intake/Output Summary (Last 24 hours) at 06/13/2019 1055 Last data filed at 06/13/2019 0522 Gross per 24 hour  Intake 312.49 ml  Output 300 ml  Net 12.49 ml     Physical Exam Gen Exam:Alert awake-not in any distress HEENT:atraumatic, normocephalic Chest: B/L clear to auscultation anteriorly CVS:S1S2 regular Abdomen:soft non tender, non distended Extremities:no edema Neurology: Non focal Skin: no rash   Data Review:    CBC Recent Labs  Lab 06/12/19 0430 06/13/19 0207  WBC 6.6 7.0  HGB 13.7 12.7  HCT 41.2 38.1  PLT 145* 166  MCV 94.3 93.8  MCH 31.4 31.3  MCHC 33.3 33.3  RDW 13.0 12.9  LYMPHSABS 0.9 0.9  MONOABS 0.2 0.6  EOSABS 0.0 0.0  BASOSABS 0.0 0.0    Chemistries  Recent Labs  Lab 06/12/19 0430 06/13/19 0207  NA 141 140  K 3.3* 3.4*  CL 100 101  CO2 25 25  GLUCOSE 132* 114*  BUN 14 19  CREATININE 0.53 0.62  CALCIUM 8.4* 8.0*  AST 50* 52*  ALT 29 28  ALKPHOS 42 39  BILITOT 1.0 0.6   ------------------------------------------------------------------------------------------------------------------ No results for input(s): CHOL, HDL, LDLCALC, TRIG, CHOLHDL, LDLDIRECT in the last 72 hours.  No results found for: HGBA1C ------------------------------------------------------------------------------------------------------------------ No results for input(s):  TSH, T4TOTAL, T3FREE, THYROIDAB in the last 72 hours.  Invalid input(s): FREET3 ------------------------------------------------------------------------------------------------------------------ Recent Labs    06/13/19 0207  FERRITIN 529*    Coagulation profile No results for  input(s): INR, PROTIME in the last 168 hours.  Recent Labs    06/12/19 0430 06/13/19 0207  DDIMER 1.42* 1.05*    Cardiac Enzymes No results for input(s): CKMB, TROPONINI, MYOGLOBIN in the last 168 hours.  Invalid input(s): CK ------------------------------------------------------------------------------------------------------------------ No results found for: BNP  Micro Results No results found for this or any previous visit (from the past 240 hour(s)).  Radiology Reports Dg Chest [place] 1v Same Day  Result Date: 06/12/2019 CLINICAL DATA:  Shortness of breath. EXAM: PORTABLE CHEST 1 VIEW COMPARISON:  June 11, 2019. FINDINGS: Stable cardiomediastinal silhouette. No pneumothorax or pleural effusion is noted. Mild bilateral perihilar opacities are noted concerning for multifocal pneumonia as described on prior CT scan. Bony thorax is unremarkable. IMPRESSION: Bilateral perihilar opacities are noted concerning for multifocal pneumonia as described on prior CT scan. Electronically Signed   By: Lupita RaiderJames  Green Jr M.D.   On: 06/12/2019 10:27

## 2019-06-13 NOTE — Progress Notes (Signed)
Alert will follow some instuction. Compliant with  Medications. Remained in chair for 3 hours on shift. Administered bath, as patient was bath she sat on the [facility]. Pt was positioned on her side. She remain on RA.   Unfortunately, the nursing team has not been able to completed her admission. After speaking with CN, we agreed that writer could attempt to reach out to the group home and legal guardian. Initially call was made to [place] [phone]. Left VM apologizing for calling early and left # for her to return call. Second, call was attempted to reach [place] [phone]. Ms. Joya Gaskins was able to give little. Writer encourage Ms. Joya Gaskins to please communicate with the Group Home inform them you give them to right to give [place] information, so her admission can be completed.   Ms. Joya Gaskins was given report on the patient's condition. She able to tell before was brought to the hospital she was taken to her primary but did not know the MD's name.   Again, Probation officer apologize for calling her early.

## 2019-06-14 LAB — COMPREHENSIVE METABOLIC PANEL
ALT: 31 U/L (ref 0–44)
AST: 45 U/L — ABNORMAL HIGH (ref 15–41)
Albumin: 3 g/dL — ABNORMAL LOW (ref 3.5–5.0)
Alkaline Phosphatase: 42 U/L (ref 38–126)
Anion gap: 9 (ref 5–15)
BUN: 18 mg/dL (ref 8–23)
CO2: 28 mmol/L (ref 22–32)
Calcium: 8.3 mg/dL — ABNORMAL LOW (ref 8.9–10.3)
Chloride: 104 mmol/L (ref 98–111)
Creatinine, Ser: 0.63 mg/dL (ref 0.44–1.00)
GFR calc Af Amer: >60 mL/min (ref >60)
GFR calc non Af Amer: >60 mL/min (ref >60)
Glucose, Bld: 189 mg/dL — ABNORMAL HIGH (ref 70–99)
Potassium: 4.1 mmol/L (ref 3.5–5.1)
Sodium: 141 mmol/L (ref 135–145)
Total Bilirubin: 0.7 mg/dL (ref 0.3–1.2)
Total Protein: 6.2 g/dL — ABNORMAL LOW (ref 6.5–8.1)

## 2019-06-14 LAB — CBC WITH DIFFERENTIAL/PLATELET
Abs Immature Granulocytes: 0.05 10*3/uL (ref 0.00–0.07)
Basophils Absolute: 0 10*3/uL (ref 0.0–0.1)
Basophils Relative: 0 %
Eosinophils Absolute: 0 10*3/uL (ref 0.0–0.5)
Eosinophils Relative: 0 %
HCT: 41.6 % (ref 36.0–46.0)
Hemoglobin: 13.7 g/dL (ref 12.0–15.0)
Immature Granulocytes: 1 %
Lymphocytes Relative: 16 %
Lymphs Abs: 0.9 10*3/uL (ref 0.7–4.0)
MCH: 31.1 pg (ref 26.0–34.0)
MCHC: 32.9 g/dL (ref 30.0–36.0)
MCV: 94.3 fL (ref 80.0–100.0)
Monocytes Absolute: 0.4 10*3/uL (ref 0.1–1.0)
Monocytes Relative: 7 %
Neutro Abs: 4.2 10*3/uL (ref 1.7–7.7)
Neutrophils Relative %: 76 %
Platelets: 210 10*3/uL (ref 150–400)
RBC: 4.41 MIL/uL (ref 3.87–5.11)
RDW: 12.7 % (ref 11.5–15.5)
WBC: 5.5 10*3/uL (ref 4.0–10.5)
nRBC: 0 % (ref 0.0–0.2)

## 2019-06-14 LAB — D-DIMER, QUANTITATIVE: D-Dimer, Quant: 0.55 ug/mL-FEU — ABNORMAL HIGH (ref 0.00–0.50)

## 2019-06-14 LAB — MAGNESIUM: Magnesium: 2.3 mg/dL (ref 1.7–2.4)

## 2019-06-14 LAB — C-REACTIVE PROTEIN: CRP: 4.2 mg/dL — ABNORMAL HIGH (ref <1.0)

## 2019-06-14 LAB — FERRITIN: Ferritin: 514 ng/mL — ABNORMAL HIGH (ref 11–307)

## 2019-06-14 MED ORDER — SODIUM CHLORIDE 0.9 % IV SOLN
100.0000 mg | Freq: Once | INTRAVENOUS | Status: AC
  Administered 2019-06-15: 100 mg via INTRAVENOUS
  Filled 2019-06-14: qty 20

## 2019-06-14 NOTE — Progress Notes (Signed)
PROGRESS NOTE                                                                                                                                                                                                             Patient Demographics:    Lisa Montoya, is a 63 y.o. female, DOB - 05-12-1956, WFU:[id number]  Outpatient Primary MD for the patient is Darrol Jump, PA-C   Admit date - 06/11/2019   LOS - 3  No chief complaint on file.      Brief Narrative: Patient is a 63 y.o. female with history of mental retardation, HTN-presented to [facility] for weakness and decreased p.o. intake-further work-up revealed pneumonia secondary to COVID-19.    Subjective:   Lying comfortably in bed-no major events overnight per RN.  She is at her baseline.  She is nonverbal-but follows commands.   Assessment  & Plan :    Covid 19 Viral pneumonia: Continues to improve-on room air-CRP trending down-continue steroids and Remdesivir.  Spoke with pharmacy-last dose of Remdesivir on 10/10.  If she remains stable-she should be able to go back to her group home on 10/10.  Have spoken with social worker today.    Fever: afebrile  O2 requirements: On room air  COVID-19 Labs: Recent Labs    06/12/19 0430 06/13/19 0207 06/14/19 0105  DDIMER 1.42* 1.05* 0.55*  FERRITIN  --  529* 514*  CRP 8.2* 6.2* 4.2*    No results found for: SARSCOV2NAA   COVID-19 Medications: Steroids:10/6>> Remdesivir:10/7>> Actemra:Not indicated Convalescent Plasma:Not indicated Research Studies:N/A  Other medications: Diuretics:Euvolemic-no need for lasix Antibiotics:Not needed as no evidence of bacterial infection  DVT Prophylaxis  :  Lovenox   Hypokalemia: Replete and recheck.  HTN: BP controlled-continue bisoprolol-continue to hold HCTZ.    Dyslipidemia: Continue statin  Mental retardation: Spoke with legal Marlowe Alt on  10/8-at baseline patient is nonverbal-but is able to walk on her own.  She is able to follow commands as well.  ABG: No results found for: PHART, PCO2ART, PO2ART, HCO3, TCO2, ACIDBASEDEF, O2SAT  Vent Settings: N/A   Condition -Stabl  Family communication  : Spoke with Meyer Russel [id number] is the patient's legal guardian 10/8  Code Status :  Full Code  Diet :  Diet Order            Diet  Heart Room service appropriate? Yes; Fluid consistency: Thin  Diet effective now               Disposition Plan  :  Remain hospitalized-tentative discharge back to group home on 10/10-have spoken with social worker today.  Barriers to discharge: Needs to complete 5 days of IV Remdesivir  Consults  :  None  Procedures  :  None  Antibiotics  :    Anti-infectives (From admission, onward)   Start     Dose/Rate Route Frequency Ordered Stop   06/12/19 1600  remdesivir 100 mg in sodium chloride 0.9 % 250 mL IVPB     100 mg 500 mL/hr over 30 Minutes Intravenous Every 24 hours 06/12/19 0053 06/16/19 1559   06/12/19 0130  remdesivir 200 mg in sodium chloride 0.9 % 250 mL IVPB     200 mg 500 mL/hr over 30 Minutes Intravenous Once 06/12/19 0053 06/12/19 0215      Inpatient Medications  Scheduled Meds: . aspirin EC  81 mg Oral Daily  . atorvastatin  10 mg Oral q1800  . bisoprolol  5 mg Oral Daily  . dexamethasone (DECADRON) injection  6 mg Intravenous Q24H  . enoxaparin (LOVENOX) injection  40 mg Subcutaneous QHS  . sodium chloride flush  3 mL Intravenous Q12H  . timolol  1-2 drop Both Eyes BID  . vitamin C  500 mg Oral Daily  . zinc sulfate  220 mg Oral Daily   Continuous Infusions: . sodium chloride 250 mL (06/11/19 2347)  . remdesivir 100 mg in NS 250 mL 100 mg (06/13/19 1730)   PRN Meds:.sodium chloride, acetaminophen, chlorpheniramine-HYDROcodone, guaiFENesin-dextromethorphan, ondansetron **OR** ondansetron (ZOFRAN) IV, polyvinyl alcohol, sodium chloride flush   Time  Spent in minutes  25  See all Orders from today for further details   Jeoffrey MassedShanker Shiloh Swopes M.D on 06/14/2019 at 11:18 AM  To page go to [email or web address] - use universal password  [facility] -  Office  [phone]    Objective:   Vitals:   06/13/19 2000 06/14/19 0400 06/14/19 0634 06/14/19 0800  BP:  134/61 134/62 100/64  Pulse:  65 65 60  Resp: 17  17 16   Temp:  98.2 F (36.8 C) 98.2 F (36.8 C) (!) 97.5 F (36.4 C)  TempSrc:  Oral Oral Oral  SpO2:  95%  94%  Weight:        Wt Readings from Last 3 Encounters:  06/11/19 89.3 kg     Intake/Output Summary (Last 24 hours) at 06/14/2019 1118 Last data filed at 06/14/2019 0951 Gross per 24 hour  Intake 780 ml  Output 700 ml  Net 80 ml     Physical Exam Gen Exam:Alert awake-not in any distress HEENT:atraumatic, normocephalic Chest: B/L clear to auscultation anteriorly CVS:S1S2 regular Abdomen:soft non tender, non distended Extremities:no edema Neurology: Non focal Skin: no rash   Data Review:    CBC Recent Labs  Lab 06/12/19 0430 06/13/19 0207 06/14/19 0105  WBC 6.6 7.0 5.5  HGB 13.7 12.7 13.7  HCT 41.2 38.1 41.6  PLT 145* 166 210  MCV 94.3 93.8 94.3  MCH 31.4 31.3 31.1  MCHC 33.3 33.3 32.9  RDW 13.0 12.9 12.7  LYMPHSABS 0.9 0.9 0.9  MONOABS 0.2 0.6 0.4  EOSABS 0.0 0.0 0.0  BASOSABS 0.0 0.0 0.0    Chemistries  Recent Labs  Lab 06/12/19 0430 06/13/19 0207 06/14/19 0105  NA 141 140 141  K 3.3* 3.4* 4.1  CL 100 101 104  CO2  25 25 28   GLUCOSE 132* 114* 189*  BUN 14 19 18   CREATININE 0.53 0.62 0.63  CALCIUM 8.4* 8.0* 8.3*  MG  --   --  2.3  AST 50* 52* 45*  ALT 29 28 31   ALKPHOS 42 39 42  BILITOT 1.0 0.6 0.7   ------------------------------------------------------------------------------------------------------------------ No results for input(s): CHOL, HDL, LDLCALC, TRIG, CHOLHDL, LDLDIRECT in the last 72 hours.  No results found for: HGBA1C  ------------------------------------------------------------------------------------------------------------------ No results for input(s): TSH, T4TOTAL, T3FREE, THYROIDAB in the last 72 hours.  Invalid input(s): FREET3 ------------------------------------------------------------------------------------------------------------------ Recent Labs    06/13/19 0207 06/14/19 0105  FERRITIN 529* 514*    Coagulation profile No results for input(s): INR, PROTIME in the last 168 hours.  Recent Labs    06/13/19 0207 06/14/19 0105  DDIMER 1.05* 0.55*    Cardiac Enzymes No results for input(s): CKMB, TROPONINI, MYOGLOBIN in the last 168 hours.  Invalid input(s): CK ------------------------------------------------------------------------------------------------------------------ No results found for: BNP  Micro Results No results found for this or any previous visit (from the past 240 hour(s)).  Radiology Reports Dg Chest [place] 1v Same Day  Result Date: 06/12/2019 CLINICAL DATA:  Shortness of breath. EXAM: PORTABLE CHEST 1 VIEW COMPARISON:  June 11, 2019. FINDINGS: Stable cardiomediastinal silhouette. No pneumothorax or pleural effusion is noted. Mild bilateral perihilar opacities are noted concerning for multifocal pneumonia as described on prior CT scan. Bony thorax is unremarkable. IMPRESSION: Bilateral perihilar opacities are noted concerning for multifocal pneumonia as described on prior CT scan. Electronically Signed   By: [place] M.D.   On: 06/12/2019 10:27

## 2019-06-14 NOTE — Evaluation (Signed)
Physical Therapy Evaluation Patient Details Name: Lisa Montoya MRN: [id number] DOB: 09/08/1955 Today's Date: 06/14/2019   History of Present Illness  63 y/o f SNF resident w/ hx of developmental deficits, HTN hospitalized w/ weakness and deacreased p.o intake found to have PNA sec to COVID 19.  Clinical Impression   Pt admitted with above diagnosis. Pt currently with functional limitations due to the deficits listed below (see PT Problem List). Pt will benefit from skilled PT to increase their independence and safety with mobility to allow discharge to the venue listed below.       Follow Up Recommendations SNF    Equipment Recommendations  (TBD)    Recommendations for Other Services       Precautions / Restrictions Precautions Precautions: Fall;Other (comment)(non verbal) Restrictions Weight Bearing Restrictions: No      Mobility  Bed Mobility Overal bed mobility: Needs Assistance Bed Mobility: Sidelying to Sit   Sidelying to sit: Supervision       General bed mobility comments: does well uses bed fixutres to complete  Transfers Overall transfer level: Needs assistance Equipment used: 1 person hand held assist Transfers: Sit to/from Stand;Stand Pivot Transfers Sit to Stand: Min assist Stand pivot transfers: Min assist          Ambulation/Gait             General Gait Details: declined to attempt ambulation, shaking head vehemently  Stairs            Wheelchair Mobility    Modified Rankin (Stroke Patients Only)       Balance Overall balance assessment: Needs assistance Sitting-balance support: Feet supported Sitting balance-Leahy Scale: Fair     Standing balance support: Single extremity supported Standing balance-Leahy Scale: Fair                               Pertinent Vitals/Pain Pain Assessment: No/denies pain    Home Living Family/patient expects to be discharged to:: Group home                  Additional Comments: chart states she lives in group home vs SNF, maybe she normally lives at group home and was sent to SNF, pt is unable to further clarify    Prior Function Level of Independence: Needs assistance(states was I but not sure of accuracy of this)   Gait / Transfers Assistance Needed: HHA  ADL's / Homemaking Assistance Needed: needs assist with ADLs and IADLs but states she helps around home        Hand Dominance        Extremity/Trunk Assessment   Upper Extremity Assessment Upper Extremity Assessment: Overall WFL for tasks assessed    Lower Extremity Assessment Lower Extremity Assessment: Overall WFL for tasks assessed       Communication   Communication: Expressive difficulties  Cognition Arousal/Alertness: Awake/alert Behavior During Therapy: Restless Overall Cognitive Status: History of cognitive impairments - at baseline                                        General Comments General comments (skin integrity, edema, etc.): Pt found in bed is agreeable to assessment but not fully wanting to participate. Able to nod head yes and shake head no with some grunting. through this was able to communicate that she is able to ambulate  w/ assist and she just wants to go home. Pt is on room air and sats do remain in high 90s throughout. was only able to take few steps at EOB, then stand pivot to recliner.     Exercises     Assessment/Plan    PT Assessment Patient needs continued PT services(while in hospital)  PT Problem List Decreased strength;Decreased activity tolerance;Decreased balance;Decreased coordination;Decreased cognition;Decreased safety awareness       PT Treatment Interventions Gait training;Functional mobility training;Therapeutic activities;Therapeutic exercise;Balance training;Neuromuscular re-education;Patient/family education    PT Goals (Current goals can be found in the Care Plan section)  Acute Rehab PT Goals Patient  Stated Goal: go home Time For Goal Achievement: 06/28/19 Potential to Achieve Goals: Good    Frequency Min 3X/week   Barriers to discharge        Co-evaluation               AM-PAC PT "6 Clicks" Mobility  Outcome Measure Help needed turning from your back to your side while in a flat bed without using bedrails?: A Little Help needed moving from lying on your back to sitting on the side of a flat bed without using bedrails?: A Little Help needed moving to and from a bed to a chair (including a wheelchair)?: A Little Help needed standing up from a chair using your arms (e.g., wheelchair or bedside chair)?: A Little Help needed to walk in hospital room?: A Lot Help needed climbing 3-5 steps with a railing? : A Lot 6 Click Score: 16    End of Session   Activity Tolerance: Treatment limited secondary to medical complications (Comment) Patient left: in chair;with call bell/phone within reach;with chair alarm set Nurse Communication: Mobility status PT Visit Diagnosis: Other abnormalities of gait and mobility (R26.89);Muscle weakness (generalized) (M62.81)    Time: 5809-9833 PT Time Calculation (min) (ACUTE ONLY): 13 min   Charges:   PT Evaluation $PT Eval Moderate Complexity: 1 Mod          Drema Pry, PT   Freddi Starr 06/14/2019, 3:03 PM

## 2019-06-14 NOTE — Plan of Care (Signed)
  Problem: Coping: Goal: Psychosocial and spiritual needs will be supported Outcome: Progressing   Problem: Respiratory: Goal: Will maintain a patent airway Outcome: Progressing Goal: Complications related to the disease process, condition or treatment will be avoided or minimized Outcome: Progressing   Problem: Clinical Measurements: Goal: Will remain free from infection Outcome: Progressing

## 2019-06-14 NOTE — Progress Notes (Signed)
Spoken with legal guardian, Meyer Russel. Educated her on the pt's present condition and possibility of being discharge tomorrow to group home.   Legal guardian noted, "my husband and I have been tested positive for COVID, today. Educated Ms. Wright on the disease, positioning, and if symptoms do not hesitate to go to the ED.

## 2019-06-14 NOTE — Plan of Care (Signed)
  Problem: Education: Goal: Knowledge of risk factors and measures for prevention of condition will improve Outcome: Progressing   Problem: Coping: Goal: Psychosocial and spiritual needs will be supported Outcome: Progressing   Problem: Respiratory: Goal: Will maintain a patent airway Outcome: Progressing Goal: Complications related to the disease process, condition or treatment will be avoided or minimized Outcome: Progressing   Problem: Education: Goal: Knowledge of General Education information will improve Description: Including pain rating scale, medication(s)/side effects and non-pharmacologic comfort measures Outcome: Progressing   Problem: Health Behavior/Discharge Planning: Goal: Ability to manage health-related needs will improve Outcome: Progressing   Problem: Health Behavior/Discharge Planning: Goal: Ability to manage health-related needs will improve Outcome: Progressing   Problem: Clinical Measurements: Goal: Ability to maintain clinical measurements within normal limits will improve Outcome: Progressing Goal: Will remain free from infection Outcome: Progressing Goal: Diagnostic test results will improve Outcome: Progressing Goal: Respiratory complications will improve Outcome: Progressing Goal: Cardiovascular complication will be avoided Outcome: Progressing   Problem: Activity: Goal: Risk for activity intolerance will decrease Outcome: Progressing   Problem: Nutrition: Goal: Adequate nutrition will be maintained Outcome: Progressing   Problem: Coping: Goal: Level of anxiety will decrease Outcome: Progressing   Problem: Elimination: Goal: Will not experience complications related to bowel motility Outcome: Progressing Goal: Will not experience complications related to urinary retention Outcome: Progressing   Problem: Pain Managment: Goal: General experience of comfort will improve Outcome: Progressing   Problem: Safety: Goal: Ability to  remain free from injury will improve Outcome: Progressing   Problem: Skin Integrity: Goal: Risk for impaired skin integrity will decrease Outcome: Progressing   

## 2019-06-15 LAB — COMPREHENSIVE METABOLIC PANEL
ALT: 31 U/L (ref 0–44)
AST: 35 U/L (ref 15–41)
Albumin: 3 g/dL — ABNORMAL LOW (ref 3.5–5.0)
Alkaline Phosphatase: 42 U/L (ref 38–126)
Anion gap: 10 (ref 5–15)
BUN: 18 mg/dL (ref 8–23)
CO2: 24 mmol/L (ref 22–32)
Calcium: 8.4 mg/dL — ABNORMAL LOW (ref 8.9–10.3)
Chloride: 108 mmol/L (ref 98–111)
Creatinine, Ser: 0.59 mg/dL (ref 0.44–1.00)
GFR calc Af Amer: >60 mL/min (ref >60)
GFR calc non Af Amer: >60 mL/min (ref >60)
Glucose, Bld: 129 mg/dL — ABNORMAL HIGH (ref 70–99)
Potassium: 4 mmol/L (ref 3.5–5.1)
Sodium: 142 mmol/L (ref 135–145)
Total Bilirubin: 0.7 mg/dL (ref 0.3–1.2)
Total Protein: 5.8 g/dL — ABNORMAL LOW (ref 6.5–8.1)

## 2019-06-15 LAB — CBC WITH DIFFERENTIAL/PLATELET
Abs Immature Granulocytes: 0.08 10*3/uL — ABNORMAL HIGH (ref 0.00–0.07)
Basophils Absolute: 0 10*3/uL (ref 0.0–0.1)
Basophils Relative: 0 %
Eosinophils Absolute: 0 10*3/uL (ref 0.0–0.5)
Eosinophils Relative: 0 %
HCT: 40.6 % (ref 36.0–46.0)
Hemoglobin: 13.3 g/dL (ref 12.0–15.0)
Immature Granulocytes: 1 %
Lymphocytes Relative: 13 %
Lymphs Abs: 1.3 10*3/uL (ref 0.7–4.0)
MCH: 30.7 pg (ref 26.0–34.0)
MCHC: 32.8 g/dL (ref 30.0–36.0)
MCV: 93.8 fL (ref 80.0–100.0)
Monocytes Absolute: 0.5 10*3/uL (ref 0.1–1.0)
Monocytes Relative: 5 %
Neutro Abs: 8.1 10*3/uL — ABNORMAL HIGH (ref 1.7–7.7)
Neutrophils Relative %: 81 %
Platelets: 234 10*3/uL (ref 150–400)
RBC: 4.33 MIL/uL (ref 3.87–5.11)
RDW: 12.6 % (ref 11.5–15.5)
WBC: 9.9 10*3/uL (ref 4.0–10.5)
nRBC: 0 % (ref 0.0–0.2)

## 2019-06-15 LAB — C-REACTIVE PROTEIN: CRP: 2 mg/dL — ABNORMAL HIGH (ref <1.0)

## 2019-06-15 LAB — D-DIMER, QUANTITATIVE: D-Dimer, Quant: 0.51 ug/mL-FEU — ABNORMAL HIGH (ref 0.00–0.50)

## 2019-06-15 LAB — FERRITIN: Ferritin: 434 ng/mL — ABNORMAL HIGH (ref 11–307)

## 2019-06-15 NOTE — Progress Notes (Signed)
CSW attempted to contact guardian, [place], several times through the afternoon. No answer, no response. CSW to continue to attempt to reach guardian to discuss discharge plan for patient.  Laveda Abbe, [place] Clinical Social Worker [phone]

## 2019-06-15 NOTE — Progress Notes (Signed)
PROGRESS NOTE                                                                                                                                                                                                             Patient Demographics:    Lisa Montoya, is a 63 y.o. female, DOB - 1955-12-16, ZOX:[id number]  Outpatient Primary MD for the patient is Gus HeightJohnson, Andrea, PA-C   Admit date - 06/11/2019   LOS - 4  No chief complaint on file.      Brief Narrative: Patient is a 63 y.o. female with history of mental retardation, HTN-[facility] and decreased p.o. intake-further work-up revealed pneumonia secondary to COVID-19.    Subjective:   Lying comfortably comfortably in bed-she was eating breakfast-she is nonverbal at baseline but follows commands.  She grunts and mumbles at baseline.   Assessment  & Plan :    Covid 19 Viral pneumonia: Stable-CRP coming down.  On room air.  Last dose of Remdesivir today.  Continue steroids for a few more days.    Fever: afebrile  O2 requirements: On room air  COVID-19 Labs: Recent Labs    06/13/19 0207 06/14/19 0105 06/15/19 0342  DDIMER 1.05* 0.55* 0.51*  FERRITIN 529* 514* 434*  CRP 6.2* 4.2* 2.0*    No results found for: SARSCOV2NAA   COVID-19 Medications: Steroids:10/6>> Remdesivir:10/7>> 10/10 Actemra:Not indicated Convalescent Plasma:Not indicated Research Studies:N/A  Other medications: Diuretics:Euvolemic-no need for lasix Antibiotics:Not needed as no evidence of bacterial infection  DVT Prophylaxis  :  Lovenox   Hypokalemia: Treated  HTN: BP controlled-continue bisoprolol-continue to hold HCTZ.    Dyslipidemia: Continue statin  Mental retardation: Spoke with legal Ear[place] Abideguardian-Brittany Wright on 10/8-at baseline patient is nonverbal-but is able to walk on her own.  She is able to follow commands as well.  ABG: No results  found for: PHART, PCO2ART, PO2ART, HCO3, TCO2, ACIDBASEDEF, O2SAT  Vent Settings: N/A   Condition -Stabl  Family communication  : Spoke with Gena FrayBrittany Wright [phone]-she is the patient's legal guardian 10/8  Code Status :  Full Code  Diet :  Diet Order            Diet Heart Room service appropriate? Yes; Fluid consistency: Thin  Diet effective now  Disposition Plan  : SNF versus back to group home with home health services-await recommendations from social work  Barriers to discharge: Seen by PT-recommendations are for SNF-social work evaluation in Agricultural consultant unable to get in touch with group home or legal guardian.  Will await further recommendations from social work.  Consults  :  None  Procedures  :  None  Antibiotics  :    Anti-infectives (From admission, onward)   Start     Dose/Rate Route Frequency Ordered Stop   06/15/19 1000  remdesivir 100 mg in sodium chloride 0.9 % 250 mL IVPB     100 mg 500 mL/hr over 30 Minutes Intravenous  Once 06/14/19 1119 06/15/19 1119   06/12/19 1600  remdesivir 100 mg in sodium chloride 0.9 % 250 mL IVPB     100 mg 500 mL/hr over 30 Minutes Intravenous Every 24 hours 06/12/19 0053 06/14/19 1741   06/12/19 0130  remdesivir 200 mg in sodium chloride 0.9 % 250 mL IVPB     200 mg 500 mL/hr over 30 Minutes Intravenous Once 06/12/19 0053 06/12/19 0215      Inpatient Medications  Scheduled Meds: . aspirin EC  81 mg Oral Daily  . atorvastatin  10 mg Oral q1800  . bisoprolol  5 mg Oral Daily  . dexamethasone (DECADRON) injection  6 mg Intravenous Q24H  . enoxaparin (LOVENOX) injection  40 mg Subcutaneous QHS  . sodium chloride flush  3 mL Intravenous Q12H  . timolol  1-2 drop Both Eyes BID  . vitamin C  500 mg Oral Daily  . zinc sulfate  220 mg Oral Daily   Continuous Infusions: . sodium chloride 250 mL (06/11/19 2347)   PRN Meds:.sodium chloride, acetaminophen, chlorpheniramine-HYDROcodone,  guaiFENesin-dextromethorphan, ondansetron **OR** ondansetron (ZOFRAN) IV, polyvinyl alcohol, sodium chloride flush   Time Spent in minutes  25  See all Orders from today for further details   Jeoffrey Massed M.D on 06/15/2019 at 2:15 PM  To page go to [email or web address] - use universal password  [facility] -  Office  [phone]    Objective:   Vitals:   06/14/19 1600 06/14/19 2004 06/15/19 0505 06/15/19 0733  BP: 120/74 138/69 132/63 (!) 127/57  Pulse: 69 64 61 67  Resp: 20 16 16 18   Temp: 97.6 F (36.4 C) 98.8 F (37.1 C) 98.6 F (37 C) 98.3 F (36.8 C)  TempSrc: Oral Oral Oral Oral  SpO2: 98% 96% 94% 96%  Weight:  89 kg    Height:  5' (1.524 m)      Wt Readings from Last 3 Encounters:  06/14/19 89 kg     Intake/Output Summary (Last 24 hours) at 06/15/2019 1415 Last data filed at 06/15/2019 1017 Gross per 24 hour  Intake 243 ml  Output 400 ml  Net -157 ml     Physical Exam Gen Exam:Alert awake-not in any distress HEENT:atraumatic, normocephalic Chest: B/L clear to auscultation anteriorly CVS:S1S2 regular Abdomen:soft non tender, non distended Extremities:no edema Neurology: Non focal Skin: no rash   Data Review:    CBC Recent Labs  Lab 06/12/19 0430 06/13/19 0207 06/14/19 0105 06/15/19 0342  WBC 6.6 7.0 5.5 9.9  HGB 13.7 12.7 13.7 13.3  HCT 41.2 38.1 41.6 40.6  PLT 145* 166 210 234  MCV 94.3 93.8 94.3 93.8  MCH 31.4 31.3 31.1 30.7  MCHC 33.3 33.3 32.9 32.8  RDW 13.0 12.9 12.7 12.6  LYMPHSABS 0.9 0.9 0.9 1.3  MONOABS 0.2 0.6 0.4 0.5  EOSABS  0.0 0.0 0.0 0.0  BASOSABS 0.0 0.0 0.0 0.0    Chemistries  Recent Labs  Lab 06/12/19 0430 06/13/19 0207 06/14/19 0105 06/15/19 0342  NA 141 140 141 142  K 3.3* 3.4* 4.1 4.0  CL 100 101 104 108  CO2 25 25 28 24   GLUCOSE 132* 114* 189* 129*  BUN 14 19 18 18   CREATININE 0.53 0.62 0.63 0.59  CALCIUM 8.4* 8.0* 8.3* 8.4*  MG  --   --  2.3  --   AST 50* 52* 45* 35  ALT 29 28 31 31    ALKPHOS 42 39 42 42  BILITOT 1.0 0.6 0.7 0.7   ------------------------------------------------------------------------------------------------------------------ No results for input(s): CHOL, HDL, LDLCALC, TRIG, CHOLHDL, LDLDIRECT in the last 72 hours.  No results found for: HGBA1C ------------------------------------------------------------------------------------------------------------------ No results for input(s): TSH, T4TOTAL, T3FREE, THYROIDAB in the last 72 hours.  Invalid input(s): FREET3 ------------------------------------------------------------------------------------------------------------------ Recent Labs    06/14/19 0105 06/15/19 0342  FERRITIN 514* 434*    Coagulation profile No results for input(s): INR, PROTIME in the last 168 hours.  Recent Labs    06/14/19 0105 06/15/19 0342  DDIMER 0.55* 0.51*    Cardiac Enzymes No results for input(s): CKMB, TROPONINI, MYOGLOBIN in the last 168 hours.  Invalid input(s): CK ------------------------------------------------------------------------------------------------------------------ No results found for: BNP  Micro Results No results found for this or any previous visit (from the past 240 hour(s)).  Radiology Reports Dg Chest [place] 1v Same Day  Result Date: 06/12/2019 CLINICAL DATA:  Shortness of breath. EXAM: PORTABLE CHEST 1 VIEW COMPARISON:  June 11, 2019. FINDINGS: Stable cardiomediastinal silhouette. No pneumothorax or pleural effusion is noted. Mild bilateral perihilar opacities are noted concerning for multifocal pneumonia as described on prior CT scan. Bony thorax is unremarkable. IMPRESSION: Bilateral perihilar opacities are noted concerning for multifocal pneumonia as described on prior CT scan. Electronically Signed   By: Marijo Conception M.D.   On: 06/12/2019 10:27

## 2019-06-15 NOTE — Progress Notes (Signed)
CSW attempted to reach patient's legal guardian, [place]. Called the phone number listed, no answer. Unable to leave a voicemail as the voicemail box is full. CSW sent a text message requesting a call back.   Laveda Abbe, [place] Clinical Social Worker [phone]

## 2019-06-16 LAB — CBC WITH DIFFERENTIAL/PLATELET
Abs Immature Granulocytes: 0.16 10*3/uL — ABNORMAL HIGH (ref 0.00–0.07)
Basophils Absolute: 0 10*3/uL (ref 0.0–0.1)
Basophils Relative: 0 %
Eosinophils Absolute: 0 10*3/uL (ref 0.0–0.5)
Eosinophils Relative: 0 %
HCT: 40.8 % (ref 36.0–46.0)
Hemoglobin: 13.8 g/dL (ref 12.0–15.0)
Immature Granulocytes: 2 %
Lymphocytes Relative: 11 %
Lymphs Abs: 1.2 10*3/uL (ref 0.7–4.0)
MCH: 31.4 pg (ref 26.0–34.0)
MCHC: 33.8 g/dL (ref 30.0–36.0)
MCV: 92.7 fL (ref 80.0–100.0)
Monocytes Absolute: 0.6 10*3/uL (ref 0.1–1.0)
Monocytes Relative: 5 %
Neutro Abs: 8.7 10*3/uL — ABNORMAL HIGH (ref 1.7–7.7)
Neutrophils Relative %: 82 %
Platelets: 289 10*3/uL (ref 150–400)
RBC: 4.4 MIL/uL (ref 3.87–5.11)
RDW: 12.4 % (ref 11.5–15.5)
WBC: 10.6 10*3/uL — ABNORMAL HIGH (ref 4.0–10.5)
nRBC: 0 % (ref 0.0–0.2)

## 2019-06-16 LAB — FERRITIN: Ferritin: 356 ng/mL — ABNORMAL HIGH (ref 11–307)

## 2019-06-16 LAB — COMPREHENSIVE METABOLIC PANEL
ALT: 26 U/L (ref 0–44)
AST: 26 U/L (ref 15–41)
Albumin: 3 g/dL — ABNORMAL LOW (ref 3.5–5.0)
Alkaline Phosphatase: 45 U/L (ref 38–126)
Anion gap: 10 (ref 5–15)
BUN: 17 mg/dL (ref 8–23)
CO2: 25 mmol/L (ref 22–32)
Calcium: 8.4 mg/dL — ABNORMAL LOW (ref 8.9–10.3)
Chloride: 106 mmol/L (ref 98–111)
Creatinine, Ser: 0.67 mg/dL (ref 0.44–1.00)
GFR calc Af Amer: >60 mL/min (ref >60)
GFR calc non Af Amer: >60 mL/min (ref >60)
Glucose, Bld: 139 mg/dL — ABNORMAL HIGH (ref 70–99)
Potassium: 4 mmol/L (ref 3.5–5.1)
Sodium: 141 mmol/L (ref 135–145)
Total Bilirubin: 0.8 mg/dL (ref 0.3–1.2)
Total Protein: 6.1 g/dL — ABNORMAL LOW (ref 6.5–8.1)

## 2019-06-16 LAB — D-DIMER, QUANTITATIVE: D-Dimer, Quant: 0.53 ug/mL-FEU — ABNORMAL HIGH (ref 0.00–0.50)

## 2019-06-16 LAB — C-REACTIVE PROTEIN: CRP: 1.9 mg/dL — ABNORMAL HIGH (ref <1.0)

## 2019-06-16 MED ORDER — DEXAMETHASONE SODIUM PHOSPHATE 4 MG/ML IJ SOLN
4.0000 mg | INTRAMUSCULAR | Status: DC
  Administered 2019-06-16: 4 mg via INTRAVENOUS
  Filled 2019-06-16: qty 1

## 2019-06-16 NOTE — Plan of Care (Signed)
  Problem: Coping: Goal: Psychosocial and spiritual needs will be supported Outcome: Progressing   Problem: Respiratory: Goal: Will maintain a patent airway Outcome: Progressing   Problem: Respiratory: Goal: Complications related to the disease process, condition or treatment will be avoided or minimized Outcome: Progressing   Problem: Clinical Measurements: Goal: Will remain free from infection Outcome: Progressing   Problem: Clinical Measurements: Goal: Respiratory complications will improve Outcome: Progressing   Problem: Nutrition: Goal: Adequate nutrition will be maintained Outcome: Progressing   Problem: Skin Integrity: Goal: Risk for impaired skin integrity will decrease Outcome: Progressing

## 2019-06-16 NOTE — Progress Notes (Signed)
PROGRESS NOTE                                                                                                                                                                                                             Patient Demographics:    Lisa Montoya, is a 63 y.o. female, DOB - [birth year], ZGY:[id number]  Outpatient Primary MD for the patient is Gus Height, PA-C   Admit date - 06/11/2019   LOS - 5  No chief complaint on file.      Brief Narrative: Patient is a 63 y.o. female with history of mental retardation, HTN-presented to [facility] for weakness and decreased p.o. intake-further work-up revealed pneumonia secondary to COVID-19.    Subjective:   Patient in bed, appears comfortable, denies any headache, no fever, no chest pain or pressure, no shortness of breath , no abdominal pain. No focal weakness.   Assessment  & Plan :    Covid 19 Viral pneumonia: she was treated with steroids with Remdisvir, has finished her Remdisvir course, inflammatory markers stable, she looks clinically stable, will taper down steroids, advance activity and prepare for discharge in the next day or so.  She remained stable on room air and now is symptom-free.Marland Kitchen      COVID-19 Labs: Recent Labs    06/14/19 0105 06/15/19 0342 06/16/19 0135  DDIMER 0.55* 0.51* 0.53*  FERRITIN 514* 434* 356*  CRP 4.2* 2.0* 1.9*    No results found for: SARSCOV2NAA   COVID-19 Medications: Steroids:10/6>> Remdesivir:10/7>> 10/10 Actemra:Not indicated Convalescent Plasma:Not indicated Research Studies:N/A   HTN: BP controlled-continue bisoprolol-continue to hold HCTZ.    Dyslipidemia: Continue statin  Mental retardation: Spoke with legal Earley Abide on 10/8-at baseline patient is nonverbal-but is able to walk on her own.  She is able to follow commands as well.   DVT Prophylaxis  :  Lovenox   Condition -  Stable  Family communication  : Spoke with Gena Fray [id number] is the patient's legal guardian 10/8  Code Status :  Full Code  Diet :   Diet Order            Diet Heart Room service appropriate? Yes; Fluid consistency: Thin  Diet effective now               Disposition Plan  : SNF likely Monday or  Tuesday once bed is arranged  Barriers to discharge: Seen by PT-recommendations are for SNF-social work evaluation in [employer]-social worker unable to get in touch with group home or legal guardian.  Will await further recommendations from social work.  Consults  :  None  Procedures  :  None  Antibiotics  :    Anti-infectives (From admission, onward)   Start     Dose/Rate Route Frequency Ordered Stop   06/15/19 1000  remdesivir 100 mg in sodium chloride 0.9 % 250 mL IVPB     100 mg 500 mL/hr over 30 Minutes Intravenous  Once 06/14/19 1119 06/15/19 1119   06/12/19 1600  remdesivir 100 mg in sodium chloride 0.9 % 250 mL IVPB     100 mg 500 mL/hr over 30 Minutes Intravenous Every 24 hours 06/12/19 0053 06/14/19 1741   06/12/19 0130  remdesivir 200 mg in sodium chloride 0.9 % 250 mL IVPB     200 mg 500 mL/hr over 30 Minutes Intravenous Once 06/12/19 0053 06/12/19 0215      Inpatient Medications  Scheduled Meds:  aspirin EC  81 mg Oral Daily   atorvastatin  10 mg Oral q1800   bisoprolol  5 mg Oral Daily   dexamethasone (DECADRON) injection  4 mg Intravenous Q24H   enoxaparin (LOVENOX) injection  40 mg Subcutaneous QHS   sodium chloride flush  3 mL Intravenous Q12H   timolol  1-2 drop Both Eyes BID   vitamin C  500 mg Oral Daily   zinc sulfate  220 mg Oral Daily   Continuous Infusions:  sodium chloride 250 mL (06/11/19 2347)   PRN Meds:.sodium chloride, acetaminophen, chlorpheniramine-HYDROcodone, guaiFENesin-dextromethorphan, ondansetron **OR** ondansetron (ZOFRAN) IV, polyvinyl alcohol, sodium chloride flush   Time Spent in minutes  25  See all  Orders from today for further details   Susa RaringPrashant Riggins Cisek M.D on 06/16/2019 at 11:04 AM  To page go to [email or web address] - use universal password  [facility] -  Office  [phone]    Objective:   Vitals:   06/15/19 1502 06/15/19 2000 06/16/19 0352 06/16/19 0801  BP: 122/69 126/70 130/63 135/74  Pulse: 66 69 66 82  Resp: 16 17 19 16   Temp: 97.8 F (36.6 C) 98.7 F (37.1 C) 97.9 F (36.6 C) 97.9 F (36.6 C)  TempSrc: Oral Oral Oral Oral  SpO2: 97% 97% 98% 96%  Weight:      Height:        Wt Readings from Last 3 Encounters:  06/14/19 89 kg     Intake/Output Summary (Last 24 hours) at 06/16/2019 1104 Last data filed at 06/16/2019 0600 Gross per 24 hour  Intake --  Output 850 ml  Net -850 ml     Physical Exam  Awake Alert,  No new F.N deficits,  [place].AT,PERRAL Supple Neck,No JVD, No cervical lymphadenopathy appriciated.  Symmetrical Chest wall movement, Good air movement bilaterally, CTAB RRR,No Gallops, Rubs or new Murmurs, No Parasternal Heave +ve B.Sounds, Abd Soft, No tenderness, No organomegaly appriciated, No rebound - guarding or rigidity. No Cyanosis, Clubbing or edema, No new Rash or bruise   Data Review:    CBC Recent Labs  Lab 06/12/19 0430 06/13/19 0207 06/14/19 0105 06/15/19 0342 06/16/19 0135  WBC 6.6 7.0 5.5 9.9 10.6*  HGB 13.7 12.7 13.7 13.3 13.8  HCT 41.2 38.1 41.6 40.6 40.8  PLT 145* 166 210 234 289  MCV 94.3 93.8 94.3 93.8 92.7  MCH 31.4 31.3 31.1 30.7 31.4  MCHC 33.3 33.3  32.9 32.8 33.8  RDW 13.0 12.9 12.7 12.6 12.4  LYMPHSABS 0.9 0.9 0.9 1.3 1.2  MONOABS 0.2 0.6 0.4 0.5 0.6  EOSABS 0.0 0.0 0.0 0.0 0.0  BASOSABS 0.0 0.0 0.0 0.0 0.0    Chemistries  Recent Labs  Lab 06/12/19 0430 06/13/19 0207 06/14/19 0105 06/15/19 0342 06/16/19 0135  NA 141 140 141 142 141  K 3.3* 3.4* 4.1 4.0 4.0  CL 100 101 104 108 106  CO2 25 25 28 24 25   GLUCOSE 132* 114* 189* 129* 139*  BUN 14 19 18 18 17   CREATININE 0.53 0.62 0.63 0.59  0.67  CALCIUM 8.4* 8.0* 8.3* 8.4* 8.4*  MG  --   --  2.3  --   --   AST 50* 52* 45* 35 26  ALT 29 28 31 31 26   ALKPHOS 42 39 42 42 45  BILITOT 1.0 0.6 0.7 0.7 0.8   ------------------------------------------------------------------------------------------------------------------ No results for input(s): CHOL, HDL, LDLCALC, TRIG, CHOLHDL, LDLDIRECT in the last 72 hours.  No results found for: HGBA1C ------------------------------------------------------------------------------------------------------------------ No results for input(s): TSH, T4TOTAL, T3FREE, THYROIDAB in the last 72 hours.  Invalid input(s): FREET3 ------------------------------------------------------------------------------------------------------------------ Recent Labs    06/15/19 0342 06/16/19 0135  FERRITIN 434* 356*    Coagulation profile No results for input(s): INR, PROTIME in the last 168 hours.  Recent Labs    06/15/19 0342 06/16/19 0135  DDIMER 0.51* 0.53*    Cardiac Enzymes No results for input(s): CKMB, TROPONINI, MYOGLOBIN in the last 168 hours.  Invalid input(s): CK ------------------------------------------------------------------------------------------------------------------ No results found for: BNP  Micro Results No results found for this or any previous visit (from the past 240 hour(s)).  Radiology Reports Dg Chest [place] 1v Same Day  Result Date: 06/12/2019 CLINICAL DATA:  Shortness of breath. EXAM: PORTABLE CHEST 1 VIEW COMPARISON:  June 11, 2019. FINDINGS: Stable cardiomediastinal silhouette. No pneumothorax or pleural effusion is noted. Mild bilateral perihilar opacities are noted concerning for multifocal pneumonia as described on prior CT scan. Bony thorax is unremarkable. IMPRESSION: Bilateral perihilar opacities are noted concerning for multifocal pneumonia as described on prior CT scan. Electronically Signed   By: Marijo Conception M.D.   On: 06/12/2019 10:27

## 2019-06-16 NOTE — Progress Notes (Signed)
Full SBAR report received from [place], Pt is sitting in her chair with no signs of distress, she denies pain and SOB. All needs met. Call light within reach, not sure if she is using it or not. VSS

## 2019-06-16 NOTE — Progress Notes (Signed)
CSW has attempted multiple times today to reach out to guardian. No answer, and no call back. CSW sent another text message to the number listed for [place] that the patient is stable for discharge and we need to discuss her return home with her.   Laveda Abbe, [place] Clinical Social Worker [phone]

## 2019-06-17 LAB — FERRITIN: Ferritin: 344 ng/mL — ABNORMAL HIGH (ref 11–307)

## 2019-06-17 MED ORDER — DEXAMETHASONE SODIUM PHOSPHATE 4 MG/ML IJ SOLN
2.0000 mg | INTRAMUSCULAR | Status: AC
  Administered 2019-06-17 – 2019-06-19 (×3): 2 mg via INTRAVENOUS
  Filled 2019-06-17 (×3): qty 1

## 2019-06-17 NOTE — Progress Notes (Signed)
PROGRESS NOTE                                                                                                                                                                                                             Patient Demographics:    Lisa Montoya, is a 63 y.o. female, DOB - 1956/08/10, DQQ:[id number]  Outpatient Primary MD for the patient is Darrol Jump, PA-C   Admit date - 06/11/2019   LOS - 6  No chief complaint on file.      Brief Narrative: Patient is a 64 y.o. female with history of mental retardation, HTN-presented to [facility] for weakness and decreased p.o. intake-further work-up revealed pneumonia secondary to COVID-19.    Subjective:   Patient in bed, appears comfortable, denies any headache, no fever, no chest pain or pressure, no shortness of breath , no abdominal pain. No focal weakness.    Assessment  & Plan :    Covid 19 Viral pneumonia: she was treated with steroids with Remdisvir, has finished her Remdisvir course, inflammatory markers stable, she looks clinically stable, will taper down steroids, advance activity and prepare for discharge in the next day or so.  She remained stable on room air and now is symptom-free.Marland Kitchen      COVID-19 Labs: Recent Labs    06/15/19 0342 06/16/19 0135 06/17/19 0400  DDIMER 0.51* 0.53*  --   FERRITIN 434* 356* 344*  CRP 2.0* 1.9*  --     No results found for: SARSCOV2NAA   COVID-19 Medications: Steroids:10/6>> Remdesivir:10/7>> 10/10 Actemra:Not indicated Convalescent Plasma:Not indicated Research Studies:N/A   HTN: BP controlled-continue bisoprolol-continue to hold HCTZ.    Dyslipidemia: Continue statin  Mental retardation: Spoke with legal Marlowe Alt on 10/8-at baseline patient is nonverbal-but is able to walk on her own.  She is able to follow commands as well.   DVT Prophylaxis  :  Lovenox   Condition -  Stable  Family communication  : Spoke with Meyer Russel [id number] is the patient's legal guardian 10/8  Code Status :  Full Code  Diet :   Diet Order            Diet Heart Room service appropriate? Yes; Fluid consistency: Thin  Diet effective now               Disposition Plan  :  SNF likely on Tuesday once bed is arranged  Barriers to discharge: Seen by PT-recommendations are for SNF-social work evaluation in [employer]-social worker unable to get in touch with group home or legal guardian.  Will await further recommendations from social work.  Consults  :  None  Procedures  :  None  Antibiotics  :    Anti-infectives (From admission, onward)   Start     Dose/Rate Route Frequency Ordered Stop   06/15/19 1000  remdesivir 100 mg in sodium chloride 0.9 % 250 mL IVPB     100 mg 500 mL/hr over 30 Minutes Intravenous  Once 06/14/19 1119 06/15/19 1119   06/12/19 1600  remdesivir 100 mg in sodium chloride 0.9 % 250 mL IVPB     100 mg 500 mL/hr over 30 Minutes Intravenous Every 24 hours 06/12/19 0053 06/14/19 1741   06/12/19 0130  remdesivir 200 mg in sodium chloride 0.9 % 250 mL IVPB     200 mg 500 mL/hr over 30 Minutes Intravenous Once 06/12/19 0053 06/12/19 0215      Inpatient Medications  Scheduled Meds:  aspirin EC  81 mg Oral Daily   atorvastatin  10 mg Oral q1800   bisoprolol  5 mg Oral Daily   dexamethasone (DECADRON) injection  4 mg Intravenous Q24H   enoxaparin (LOVENOX) injection  40 mg Subcutaneous QHS   sodium chloride flush  3 mL Intravenous Q12H   timolol  1-2 drop Both Eyes BID   vitamin C  500 mg Oral Daily   zinc sulfate  220 mg Oral Daily   Continuous Infusions:  sodium chloride 250 mL (06/11/19 2347)   PRN Meds:.sodium chloride, acetaminophen, chlorpheniramine-HYDROcodone, guaiFENesin-dextromethorphan, ondansetron **OR** ondansetron (ZOFRAN) IV, polyvinyl alcohol, sodium chloride flush   Time Spent in minutes  25  See all Orders  from today for further details   Susa RaringPrashant Tequilla [place]au M.D on 06/17/2019 at 9:07 AM  To page go to [email or web address] - use universal password  [facility] -  Office  [phone]    Objective:   Vitals:   06/16/19 1506 06/16/19 2000 06/17/19 0400 06/17/19 0735  BP: 111/69 121/60 (!) 106/56 (!) 112/52  Pulse: 62   74  Resp: 17 16  16   Temp: 97.9 F (36.6 C) 98.6 F (37 C) 98.6 F (37 C) 97.9 F (36.6 C)  TempSrc: Oral Oral Oral Oral  SpO2: 94%   96%  Weight:      Height:        Wt Readings from Last 3 Encounters:  06/14/19 89 kg     Intake/Output Summary (Last 24 hours) at 06/17/2019 0907 Last data filed at 06/17/2019 0600 Gross per 24 hour  Intake 420 ml  Output 375 ml  Net 45 ml     Physical Exam  Awake Alert,   No new F.N deficits, Normal affect [place].AT,PERRAL Supple Neck,No JVD, No cervical lymphadenopathy appriciated.  Symmetrical Chest wall movement, Good air movement bilaterally, CTAB RRR,No Gallops, Rubs or new Murmurs, No Parasternal Heave +ve B.Sounds, Abd Soft, No tenderness, No organomegaly appriciated, No rebound - guarding or rigidity. No Cyanosis, Clubbing or edema, No new Rash or bruise    Data Review:    CBC Recent Labs  Lab 06/12/19 0430 06/13/19 0207 06/14/19 0105 06/15/19 0342 06/16/19 0135  WBC 6.6 7.0 5.5 9.9 10.6*  HGB 13.7 12.7 13.7 13.3 13.8  HCT 41.2 38.1 41.6 40.6 40.8  PLT 145* 166 210 234 289  MCV 94.3 93.8 94.3 93.8 92.7  MCH  31.4 31.3 31.1 30.7 31.4  MCHC 33.3 33.3 32.9 32.8 33.8  RDW 13.0 12.9 12.7 12.6 12.4  LYMPHSABS 0.9 0.9 0.9 1.3 1.2  MONOABS 0.2 0.6 0.4 0.5 0.6  EOSABS 0.0 0.0 0.0 0.0 0.0  BASOSABS 0.0 0.0 0.0 0.0 0.0    Chemistries  Recent Labs  Lab 06/12/19 0430 06/13/19 0207 06/14/19 0105 06/15/19 0342 06/16/19 0135  NA 141 140 141 142 141  K 3.3* 3.4* 4.1 4.0 4.0  CL 100 101 104 108 106  CO2 25 25 28 24 25   GLUCOSE 132* 114* 189* 129* 139*  BUN 14 19 18 18 17   CREATININE 0.53 0.62 0.63 0.59  0.67  CALCIUM 8.4* 8.0* 8.3* 8.4* 8.4*  MG  --   --  2.3  --   --   AST 50* 52* 45* 35 26  ALT 29 28 31 31 26   ALKPHOS 42 39 42 42 45  BILITOT 1.0 0.6 0.7 0.7 0.8   ------------------------------------------------------------------------------------------------------------------ No results for input(s): CHOL, HDL, LDLCALC, TRIG, CHOLHDL, LDLDIRECT in the last 72 hours.  No results found for: HGBA1C ------------------------------------------------------------------------------------------------------------------ No results for input(s): TSH, T4TOTAL, T3FREE, THYROIDAB in the last 72 hours.  Invalid input(s): FREET3 ------------------------------------------------------------------------------------------------------------------ Recent Labs    06/16/19 0135 06/17/19 0400  FERRITIN 356* 344*    Coagulation profile No results for input(s): INR, PROTIME in the last 168 hours.  Recent Labs    06/15/19 0342 06/16/19 0135  DDIMER 0.51* 0.53*    Cardiac Enzymes No results for input(s): CKMB, TROPONINI, MYOGLOBIN in the last 168 hours.  Invalid input(s): CK ------------------------------------------------------------------------------------------------------------------ No results found for: BNP  Micro Results No results found for this or any previous visit (from the past 240 hour(s)).  Radiology Reports Dg Chest [place] 1v Same Day  Result Date: 06/12/2019 CLINICAL DATA:  Shortness of breath. EXAM: PORTABLE CHEST 1 VIEW COMPARISON:  June 11, 2019. FINDINGS: Stable cardiomediastinal silhouette. No pneumothorax or pleural effusion is noted. Mild bilateral perihilar opacities are noted concerning for multifocal pneumonia as described on prior CT scan. Bony thorax is unremarkable. IMPRESSION: Bilateral perihilar opacities are noted concerning for multifocal pneumonia as described on prior CT scan. Electronically Signed   By: [place] M.D.   On: 06/12/2019 10:27

## 2019-06-17 NOTE — Progress Notes (Signed)
Purewick leaked, pt was bathed, linens changed, purewick changed.

## 2019-06-17 NOTE — Progress Notes (Signed)
Full report received from Louann Liv RN during BSR. POC reviewed. Pt is resting in bed and offers no signs of distress. VSS, bed alarm is set and call ight wtihin reach

## 2019-06-18 LAB — CREATININE, SERUM
Creatinine, Ser: 0.7 mg/dL (ref 0.44–1.00)
GFR calc Af Amer: >60 mL/min (ref >60)
GFR calc non Af Amer: >60 mL/min (ref >60)

## 2019-06-18 LAB — FERRITIN: Ferritin: 304 ng/mL (ref 11–307)

## 2019-06-18 NOTE — Plan of Care (Signed)
POC reviewed

## 2019-06-18 NOTE — Plan of Care (Signed)
  Problem: Coping: Goal: Psychosocial and spiritual needs will be supported Outcome: Progressing   Problem: Respiratory: Goal: Will maintain a patent airway Outcome: Progressing   Problem: Respiratory: Goal: Complications related to the disease process, condition or treatment will be avoided or minimized Outcome: Progressing   

## 2019-06-18 NOTE — TOC Transition Note (Signed)
Transition of Care [facility]) - CM/SW Discharge Note   Patient Details  Name: Lisa Montoya MRN: [id number] Date of Birth: 1956/07/16  Transition of Care [facility]) CM/SW Contact:  Ninfa Meeker, RN Phone Number: [phone] (working remotely) 06/18/2019, 3:13 PM   Clinical Narrative:   Patient is a 63 y.o. female with history of mental retardation, HTN, from [facility] -presented to [facility] for weakness and decreased p.o. intake-further work-up revealed pneumonia secondary to COVID-19, patient was transferred to [facility] for further treatment. Social Worker-Erin has previously Software engineer at [facility], [phone], who states we could set patient up for Home Health services, no preference. Case manager called referral to Jadene Pierini, [facility] Liaison. No DME needs identified. Case manager is waiting for return call from [place] to let us know if PTAR transport will be needed for discharge.     Final next level of care: Group Home Barriers to Discharge: No Barriers Identified   Patient Goals and CMS Choice   CMS Medicare.gov Compare Post Acute Care list provided to:: Patient Represenative (must comment) Choice offered to / list presented to : [place] / [place])  Discharge Placement                       Discharge Plan and Services   Discharge Planning Services: CM Consult Post Acute Care Choice: Home Health          DME Arranged: N/A DME Agency: NA       HH Arranged: NA   Date HH Agency Contacted: 06/18/19 Time HH Agency Contacted: 1500 Representative spoke with at [place]: Adela Lank  Social Determinants of Health ([place]) Interventions     Readmission Risk Interventions No flowsheet data found.

## 2019-06-18 NOTE — Progress Notes (Signed)
PROGRESS NOTE                                                                                                                                                                                                             Patient Demographics:    Lisa Montoya, is a 63 y.o. female, DOB - September 03, 1956, ZOX:[id number]  Outpatient Primary MD for the patient is Gus Height, PA-C   Admit date - 06/11/2019   LOS - 7  No chief complaint on file.      Brief Narrative: Patient is a 63 y.o. female with history of mental retardation, HTN-presented to [facility] for weakness and decreased p.o. intake-further work-up revealed pneumonia secondary to COVID-19.    Subjective:   Patient in bed, appears comfortable, denies any headache, no fever, no chest pain or pressure, no shortness of breath , no abdominal pain. No focal weakness.   Assessment  & Plan :    Covid 19 Viral pneumonia: she was treated with steroids with Remdisvir, has finished her Remdisvir course, inflammatory markers stable, she looks clinically stable, will taper down steroids, advance activity and prepare for discharge in the next day or so.  She remained stable on room air and now is symptom-free.Marland Kitchen      COVID-19 Labs: Recent Labs    06/16/19 0135 06/17/19 0400 06/18/19 0045  DDIMER 0.53*  --   --   FERRITIN 356* 344* 304  CRP 1.9*  --   --     No results found for: SARSCOV2NAA   COVID-19 Medications: Steroids:10/6>> Remdesivir:10/7>> 10/10 Actemra:Not indicated Convalescent Plasma:Not indicated Research Studies:N/A   HTN: BP controlled-continue bisoprolol-continue to hold HCTZ.    Dyslipidemia: Continue statin  Mental retardation: Spoke with legal Earley Abide on 10/8-at baseline patient is nonverbal-but is able to walk on her own.  She is able to follow commands as well.   DVT Prophylaxis  :  Lovenox   Condition - Stable   Family communication  : Spoke with Gena Fray [id number] is the patient's legal guardian 10/8  Code Status :  Full Code  Diet :   Diet Order            Diet Heart Room service appropriate? Yes; Fluid consistency: Thin  Diet effective now  Disposition Plan  : SNF as soon as bed is arranged.  Social work informed again on 06/18/2019.  Barriers to discharge: Seen by PT-recommendations are for SNF-social work evaluation in Agricultural consultant[employer]-social worker unable to get in touch with group home or legal guardian.  Will await further recommendations from social work.  Consults  :  None  Procedures  :  None  Antibiotics  :    Anti-infectives (From admission, onward)   Start     Dose/Rate Route Frequency Ordered Stop   06/15/19 1000  remdesivir 100 mg in sodium chloride 0.9 % 250 mL IVPB     100 mg 500 mL/hr over 30 Minutes Intravenous  Once 06/14/19 1119 06/15/19 1119   06/12/19 1600  remdesivir 100 mg in sodium chloride 0.9 % 250 mL IVPB     100 mg 500 mL/hr over 30 Minutes Intravenous Every 24 hours 06/12/19 0053 06/14/19 1741   06/12/19 0130  remdesivir 200 mg in sodium chloride 0.9 % 250 mL IVPB     200 mg 500 mL/hr over 30 Minutes Intravenous Once 06/12/19 0053 06/12/19 0215      Inpatient Medications  Scheduled Meds: . aspirin EC  81 mg Oral Daily  . atorvastatin  10 mg Oral q1800  . bisoprolol  5 mg Oral Daily  . dexamethasone (DECADRON) injection  2 mg Intravenous Q24H  . enoxaparin (LOVENOX) injection  40 mg Subcutaneous QHS  . sodium chloride flush  3 mL Intravenous Q12H  . timolol  1-2 drop Both Eyes BID  . vitamin C  500 mg Oral Daily  . zinc sulfate  220 mg Oral Daily   Continuous Infusions: . sodium chloride 250 mL (06/11/19 2347)   PRN Meds:.sodium chloride, acetaminophen, chlorpheniramine-HYDROcodone, guaiFENesin-dextromethorphan, [DISCONTINUED] ondansetron **OR** ondansetron (ZOFRAN) IV, polyvinyl alcohol, sodium chloride flush   Time  Spent in minutes  25  See all Orders from today for further details   Susa RaringPrashant Mikayela Deats M.D on 06/18/2019 at 9:22 AM  To page go to [email or web address] - use universal password  [facility] -  Office  [phone]    Objective:   Vitals:   06/17/19 1535 06/17/19 2000 06/18/19 0500 06/18/19 0843  BP: 113/75 115/70  121/68  Pulse: 68 72  80  Resp: 16 18 16 16   Temp: 98.6 F (37 C) 98.5 F (36.9 C) 98.4 F (36.9 C) 98.6 F (37 C)  TempSrc: Oral Oral Oral Oral  SpO2: 95% 95%  96%  Weight:      Height:        Wt Readings from Last 3 Encounters:  06/14/19 89 kg     Intake/Output Summary (Last 24 hours) at 06/18/2019 0922 Last data filed at 06/18/2019 0500 Gross per 24 hour  Intake -  Output 450 ml  Net -450 ml     Physical Exam  Awake Alert,   No new F.N deficits, Normal affect [place].AT,PERRAL Supple Neck,No JVD, No cervical lymphadenopathy appriciated.  Symmetrical Chest wall movement, Good air movement bilaterally, CTAB RRR,No Gallops, Rubs or new Murmurs, No Parasternal Heave +ve B.Sounds, Abd Soft, No tenderness, No organomegaly appriciated, No rebound - guarding or rigidity. No Cyanosis, Clubbing or edema, No new Rash or bruise    Data Review:    CBC Recent Labs  Lab 06/12/19 0430 06/13/19 0207 06/14/19 0105 06/15/19 0342 06/16/19 0135  WBC 6.6 7.0 5.5 9.9 10.6*  HGB 13.7 12.7 13.7 13.3 13.8  HCT 41.2 38.1 41.6 40.6 40.8  PLT 145* 166 210 234 289  MCV 94.3 93.8 94.3 93.8 92.7  MCH 31.4 31.3 31.1 30.7 31.4  MCHC 33.3 33.3 32.9 32.8 33.8  RDW 13.0 12.9 12.7 12.6 12.4  LYMPHSABS 0.9 0.9 0.9 1.3 1.2  MONOABS 0.2 0.6 0.4 0.5 0.6  EOSABS 0.0 0.0 0.0 0.0 0.0  BASOSABS 0.0 0.0 0.0 0.0 0.0    Chemistries  Recent Labs  Lab 06/12/19 0430 06/13/19 0207 06/14/19 0105 06/15/19 0342 06/16/19 0135 06/18/19 0045  NA 141 140 141 142 141  --   K 3.3* 3.4* 4.1 4.0 4.0  --   CL 100 101 104 108 106  --   CO2 25 25 28 24 25   --   GLUCOSE 132* 114* 189*  129* 139*  --   BUN 14 19 18 18 17   --   CREATININE 0.53 0.62 0.63 0.59 0.67 0.70  CALCIUM 8.4* 8.0* 8.3* 8.4* 8.4*  --   MG  --   --  2.3  --   --   --   AST 50* 52* 45* 35 26  --   ALT 29 28 31 31 26   --   ALKPHOS 42 39 42 42 45  --   BILITOT 1.0 0.6 0.7 0.7 0.8  --    ------------------------------------------------------------------------------------------------------------------ No results for input(s): CHOL, HDL, LDLCALC, TRIG, CHOLHDL, LDLDIRECT in the last 72 hours.  No results found for: HGBA1C ------------------------------------------------------------------------------------------------------------------ No results for input(s): TSH, T4TOTAL, T3FREE, THYROIDAB in the last 72 hours.  Invalid input(s): FREET3 ------------------------------------------------------------------------------------------------------------------ Recent Labs    06/17/19 0400 06/18/19 0045  FERRITIN 344* 304    Coagulation profile No results for input(s): INR, PROTIME in the last 168 hours.  Recent Labs    06/16/19 0135  DDIMER 0.53*    Cardiac Enzymes No results for input(s): CKMB, TROPONINI, MYOGLOBIN in the last 168 hours.  Invalid input(s): CK ------------------------------------------------------------------------------------------------------------------ No results found for: BNP  Micro Results No results found for this or any previous visit (from the past 240 hour(s)).  Radiology Reports Dg Chest [place] 1v Same Day  Result Date: 06/12/2019 CLINICAL DATA:  Shortness of breath. EXAM: PORTABLE CHEST 1 VIEW COMPARISON:  June 11, 2019. FINDINGS: Stable cardiomediastinal silhouette. No pneumothorax or pleural effusion is noted. Mild bilateral perihilar opacities are noted concerning for multifocal pneumonia as described on prior CT scan. Bony thorax is unremarkable. IMPRESSION: Bilateral perihilar opacities are noted concerning for multifocal pneumonia as described on prior CT scan.  Electronically Signed   By: Marijo Conception M.D.   On: 06/12/2019 10:27

## 2019-06-18 NOTE — Progress Notes (Signed)
Physical Therapy Treatment Patient Details Name: Lisa Montoya MRN: [id number] DOB: 20-Apr-1956 Today's Date: 06/18/2019    History of Present Illness 63 y/o f SNF resident w/ hx of developmental deficits, HTN hospitalized w/ weakness and deacreased p.o intake found to have PNA sec to COVID 19.    PT Comments    The  Patient  Participated in mobilizing to [facility], getting washed up and ambulated x 30' using RW. Patient sghould be able to return to her  Group home with HHPT as needed. Continue PT while in acute care.    Follow Up Recommendations  Home health PT(return to Group Home)     Equipment Recommendations  None recommended by PT    Recommendations for Other Services       Precautions / Restrictions Precautions Precautions: Fall;Other (comment) Precaution Comments: offer BR, should be continent    Mobility  Bed Mobility Overal bed mobility: Needs Assistance Bed Mobility: Rolling;Sidelying to Sit   Sidelying to sit: Supervision       General bed mobility comments: does well uses bed rail, multimodal cues.  Transfers Overall transfer level: Needs assistance Equipment used: Rolling walker (2 wheeled) Transfers: Sit to/from [employer] Sit to Stand: Min guard Stand pivot transfers: Min guard       General transfer comment: from bed to [facility] then to reclliner  Ambulation/Gait Ambulation/Gait assistance: Min assist Gait Distance (Feet): 30 Feet Assistive device: Rolling walker (2 wheeled) Gait Pattern/deviations: Step-to pattern Gait velocity: decr   General Gait Details: Gait is  slow, shuffling feet, tends to be v=behind RW.   Stairs             Wheelchair Mobility    Modified Rankin (Stroke Patients Only)       Balance   Sitting-balance support: Feet supported Sitting balance-Leahy Scale: Fair     Standing balance support: Single extremity supported Standing balance-Leahy Scale: Fair                               Cognition Arousal/Alertness: Awake/alert Behavior During Therapy: WFL for tasks assessed/performed Overall Cognitive Status: History of cognitive impairments - at baseline                                 General Comments: follows multimodal cues  with delay bur able to participate      Exercises      General Comments        Pertinent Vitals/Pain Pain Assessment: No/denies pain    Home Living                      Prior Function            PT Goals (current goals can now be found in the care plan section) Progress towards PT goals: Progressing toward goals    Frequency    Min 3X/week      PT Plan Current plan remains appropriate;Discharge plan needs to be updated    Co-evaluation              AM-PAC PT "6 Clicks" Mobility   Outcome Measure  Help needed turning from your back to your side while in a flat bed without using bedrails?: A Little Help needed moving from lying on your back to sitting on the side of a flat bed without using bedrails?: A Little  Help needed moving to and from a bed to a chair (including a wheelchair)?: A Little Help needed standing up from a chair using your arms (e.g., wheelchair or bedside chair)?: A Little Help needed to walk in hospital room?: A Lot Help needed climbing 3-5 steps with a railing? : A Lot 6 Click Score: 16    End of Session Equipment Utilized During Treatment: Gait belt Activity Tolerance: Patient tolerated treatment well Patient left: in chair;with call bell/phone within reach;with chair alarm set Nurse Communication: Mobility status PT Visit Diagnosis: Other abnormalities of gait and mobility (R26.89);Muscle weakness (generalized) (M62.81)     Time: 0938-1829 PT Time Calculation (min) (ACUTE ONLY): 43 min  Charges:  $Gait Training: 8-22 mins $Therapeutic Activity: 8-22 mins $Self Care/Home Management: [place]  Office [phone]    Claretha Cooper 06/18/2019, 1:15 PM

## 2019-06-19 LAB — FERRITIN: Ferritin: 349 ng/mL — ABNORMAL HIGH (ref 11–307)

## 2019-06-19 NOTE — Discharge Summary (Signed)
Lisa Montoya TKP:[id number] DOB: 13-Mar-1956 DOA: 06/11/2019  PCP: Darrol Jump, PA-C  Admit date: 06/11/2019  Discharge date: 06/19/2019  Admitted From: Home   Disposition:  Home   Recommendations for Outpatient Follow-up:   Follow up with PCP in 1-2 weeks  PCP Please obtain BMP/CBC, 2 view CXR in 1week,  (see Discharge instructions)   PCP Please follow up on the following pending results:    Home Health: None   Equipment/Devices: None  Consultations: None  Discharge Condition: Stable    CODE STATUS: Full    Diet Recommendation: Heart Healthy     CC - Cough   Brief history of present illness from the day of admission and additional interim summary    Patient is a 63 y.o. female with history of mental retardation, HTN-presented to [facility] for weakness and decreased p.o. intake-further work-up revealed pneumonia secondary to COVID-19.                                                                 Hospital Course    Covid 19 Viral pneumonia: she was treated with steroids with Remdisvir, has finished her Remdisvir course, inflammatory markers stable, she looks clinically stable, and now has been tapered off,  she has remained stable on room air and now is symptom-free.  Will be discharged back to group home today, she should wear a mask around other people for 2 more weeks from today.  Note multiple attempts were made to contact the POA by medical staff including social work but we could never get in touch with them.   HTN: BP controlled-continue bisoprolol-continue to hold HCTZ.    Dyslipidemia: Continue statin  Mental retardation: Spoke with legal Marlowe Alt on 10/8-at baseline patient is nonverbal-but is able to walk on her own.  She is able to follow commands as  well.  Discharge diagnosis     Principal Problem:   Pneumonia due to COVID-19 virus Active Problems:   Intellectual disability   HTN (hypertension)    Discharge instructions    Discharge Instructions    Diet - low sodium heart healthy   Complete by: As directed    Discharge instructions   Complete by: As directed    Wear a facemask around other people at all times for 2 more weeks from today.    Follow with Primary MD Darrol Jump, PA-C in 7 days   Get CBC, CMP, 2 view Chest X ray -  checked next visit within 1 week by Primary MD   Activity: As tolerated with Full fall precautions use walker/cane & assistance as needed  Disposition Group Home  Diet: Heart Healthy     Special Instructions: If you have smoked or chewed Tobacco  in the last 2 yrs please stop smoking, stop any regular  Alcohol  and or any Recreational drug use.  On your next visit with your primary care physician please Get Medicines reviewed and adjusted.  Please request your Prim.MD to go over all Hospital Tests and Procedure/Radiological results at the follow up, please get all Hospital records sent to your Prim MD by signing hospital release before you go home.  If you experience worsening of your admission symptoms, develop shortness of breath, life threatening emergency, suicidal or homicidal thoughts you must seek medical attention immediately by calling 911 or calling your MD immediately  if symptoms less severe.  You Must read complete instructions/literature along with all the possible adverse reactions/side effects for all the Medicines you take and that have been prescribed to you. Take any new Medicines after you have completely understood and accpet all the possible adverse reactions/side effects.   Increase activity slowly   Complete by: As directed    MyChart COVID-19 home monitoring program   Complete by: Jun 19, 2019    Is the patient willing to use the MyChart Mobile App for home  monitoring?: Yes   Temperature monitoring   Complete by: Jun 19, 2019    After how many days would you like to receive a notification of this patient's flowsheet entries?: 1      Discharge Medications   Allergies as of 06/19/2019   No Known Allergies     Medication List    TAKE these medications   acetaminophen 500 MG tablet Commonly known as: TYLENOL Take 1,000 mg by mouth every 6 (six) hours as needed for mild pain.   Antacid Anti-Gas Reg Strength 200-200-20 MG/5ML suspension Generic drug: alum & mag hydroxide-simeth Take 10 mLs by mouth every 4 (four) hours as needed for indigestion or heartburn.   aspirin EC 81 MG tablet Take 81 mg by mouth daily.   atorvastatin 10 MG tablet Commonly known as: LIPITOR Take 10 mg by mouth daily.   bisoprolol-hydrochlorothiazide 10-6.25 MG tablet Commonly known as: ZIAC Take 0.5 tablets by mouth daily.   carbamide peroxide 6.5 % OTIC solution Commonly known as: DEBROX Place 5-10 drops into both ears 2 (two) times daily as needed (for removal of ear wax for ten days).   chlorhexidine 0.12 % solution Commonly known as: PERIDEX Use as directed 15 mLs in the mouth or throat 2 (two) times daily.   clotrimazole 1 % cream Commonly known as: LOTRIMIN Apply 1 application topically daily as needed (ringworm, athlete's foot, jock/groin itch).   diphenhydrAMINE 25 MG tablet Commonly known as: BENADRYL Take 25 mg by mouth 3 (three) times daily as needed for allergies.   hydrogen peroxide 3 % external solution Apply 1 application topically as needed (wound cleaning).   lip balm Oint Apply 1 application topically as needed for lip care.   loperamide 2 MG tablet Commonly known as: IMODIUM A-D Take 4 mg by mouth 4 (four) times daily as needed for diarrhea or loose stools.   magnesium hydroxide 400 MG/5ML suspension Commonly known as: MILK OF MAGNESIA Take 30 mLs by mouth daily as needed for mild constipation.   multivitamin  tablet Take 1 tablet by mouth daily.   neomycin-bacitracin-polymyxin [phone] ointment Apply 1 application topically 3 (three) times daily as needed (wound care or skin lesions).   OFF SMOOTH & DRY EX Apply 1 application topically as needed (for insect repellant on outings).   polyvinyl alcohol 1.4 % ophthalmic solution Commonly known as: LIQUIFILM TEARS Place 1-2 drops into both eyes as needed for  dry eyes.   selenium sulfide 1 % Lotn Commonly known as: SELSUN Apply 1 application topically as needed for irritation.   sodium chloride 0.65 % Soln nasal spray Commonly known as: OCEAN Place 1-2 sprays into both nostrils as needed for congestion.   SUNBLOCK SPF30 EX Apply 1 application topically as needed (to protect from sunburn on outings).   THROAT SPRAY MT Use as directed 2 sprays in the mouth or throat as needed (five times daily for sore throat).   timolol 0.25 % ophthalmic solution Commonly known as: BETIMOL Place 1-2 drops into both eyes 2 (two) times daily.       Follow-up Information    Gus Height, PA-C. Schedule an appointment as soon as possible for a visit in 1 week(s).   Specialty: Family Medicine Contact information: [address] [place] [address] [phone]           Major procedures and Radiology Reports - PLEASE review detailed and final reports thoroughly  -         Dg Chest [facility] 1v Same Day  Result Date: 06/12/2019 CLINICAL DATA:  Shortness of breath. EXAM: PORTABLE CHEST 1 VIEW COMPARISON:  June 11, 2019. FINDINGS: Stable cardiomediastinal silhouette. No pneumothorax or pleural effusion is noted. Mild bilateral perihilar opacities are noted concerning for multifocal pneumonia as described on prior CT scan. Bony thorax is unremarkable. IMPRESSION: Bilateral perihilar opacities are noted concerning for multifocal pneumonia as described on prior CT scan. Electronically Signed   By: Lupita Raider M.D.   On: 06/12/2019 10:27     Micro Results     No results found for this or any previous visit (from the past 240 hour(s)).  Today   Subjective    Lisa Montoya today has no headache,no chest abdominal pain,no new weakness tingling or numbness, feels much better     Objective   Blood pressure 109/68, pulse 89, temperature 97.9 F (36.6 C), temperature source Oral, resp. rate 16, height 5' (1.524 m), weight 89 kg, SpO2 97 %.  No intake or output data in the 24 hours ending 06/19/19 0931  Exam Awake Alert,   No new F.N deficits, Normal affect [place].AT,PERRAL Supple Neck,No JVD, No cervical lymphadenopathy appriciated.  Symmetrical Chest wall movement, Good air movement bilaterally, CTAB RRR,No Gallops,Rubs or new Murmurs, No Parasternal Heave +ve B.Sounds, Abd Soft, Non tender, No organomegaly appriciated, No rebound -guarding or rigidity. No Cyanosis, Clubbing or edema, No new Rash or bruise   Data Review   CBC w Diff:  Lab Results  Component Value Date   WBC 10.6 (H) 06/16/2019   HGB 13.8 06/16/2019   HCT 40.8 06/16/2019   PLT 289 06/16/2019   LYMPHOPCT 11 06/16/2019   MONOPCT 5 06/16/2019   EOSPCT 0 06/16/2019   BASOPCT 0 06/16/2019    CMP:  Lab Results  Component Value Date   NA 141 06/16/2019   K 4.0 06/16/2019   CL 106 06/16/2019   CO2 25 06/16/2019   BUN 17 06/16/2019   CREATININE 0.70 06/18/2019   PROT 6.1 (L) 06/16/2019   ALBUMIN 3.0 (L) 06/16/2019   BILITOT 0.8 06/16/2019   ALKPHOS 45 06/16/2019   AST 26 06/16/2019   ALT 26 06/16/2019  .   Total Time in preparing paper work, data evaluation and todays exam - 35 minutes  Susa Raring M.D on 06/19/2019 at 9:31 AM  [facility]   Office  [phone]

## 2019-06-19 NOTE — Plan of Care (Signed)

## 2019-06-19 NOTE — Discharge Instructions (Signed)
Wear a facemask around other people at all times for 2 more weeks from today.    Follow with Primary MD Gus Height, PA-C in 7 days   Get CBC, CMP, 2 view Chest X ray -  checked next visit within 1 week by Primary MD   Activity: As tolerated with Full fall precautions use walker/cane & assistance as needed  Disposition Group Home  Diet: Heart Healthy     Special Instructions: If you have smoked or chewed Tobacco  in the last 2 yrs please stop smoking, stop any regular Alcohol  and or any Recreational drug use.  On your next visit with your primary care physician please Get Medicines reviewed and adjusted.  Please request your Prim.MD to go over all Hospital Tests and Procedure/Radiological results at the follow up, please get all Hospital records sent to your Prim MD by signing hospital release before you go home.  If you experience worsening of your admission symptoms, develop shortness of breath, life threatening emergency, suicidal or homicidal thoughts you must seek medical attention immediately by calling 911 or calling your MD immediately  if symptoms less severe.  You Must read complete instructions/literature along with all the possible adverse reactions/side effects for all the Medicines you take and that have been prescribed to you. Take any new Medicines after you have completely understood and accpet all the possible adverse reactions/side effects.       Person Under Monitoring Name: Lisa Montoya  Location: [address] [place] [address]   Infection Prevention Recommendations for Individuals Confirmed to have, or Being Evaluated for, 2019 Novel Coronavirus (COVID-19) Infection Who Receive Care at Home  Individuals who are confirmed to have, or are being evaluated for, COVID-19 should follow the prevention steps below until a healthcare provider or local or state health department says they can return to normal activities.  Stay home except to get  medical care You should restrict activities outside your home, except for getting medical care. Do not go to work, school, or public areas, and do not use public transportation or taxis.  Call ahead before visiting your doctor Before your medical appointment, call the healthcare provider and tell them that you have, or are being evaluated for, COVID-19 infection. This will help the healthcare providers office take steps to keep other people from getting infected. Ask your healthcare provider to call the local or state health department.  Monitor your symptoms Seek prompt medical attention if your illness is worsening (e.g., difficulty breathing). Before going to your medical appointment, call the healthcare provider and tell them that you have, or are being evaluated for, COVID-19 infection. Ask your healthcare provider to call the local or state health department.  Wear a facemask You should wear a facemask that covers your nose and mouth when you are in the same room with other people and when you visit a healthcare provider. People who live with or visit you should also wear a facemask while they are in the same room with you.  Separate yourself from other people in your home As much as possible, you should stay in a different room from other people in your home. Also, you should use a separate bathroom, if available.  Avoid sharing household items You should not share dishes, drinking glasses, cups, eating utensils, towels, bedding, or other items with other people in your home. After using these items, you should wash them thoroughly with soap and water.  Cover your coughs and sneezes  Cover your mouth and nose with a tissue when you cough or sneeze, or you can cough or sneeze into your sleeve. Throw used tissues in a lined trash can, and immediately wash your hands with soap and water for at least 20 seconds or use an alcohol-based hand rub.  Wash your [employer]hands Wash your hands  often and thoroughly with soap and water for at least 20 seconds. You can use an alcohol-based hand sanitizer if soap and water are not available and if your hands are not visibly dirty. Avoid touching your eyes, nose, and mouth with unwashed hands.   Prevention Steps for Caregivers and Household Members of Individuals Confirmed to have, or Being Evaluated for, COVID-19 Infection Being Cared for in the Home  If you live with, or provide care at home for, a person confirmed to have, or being evaluated for, COVID-19 infection please follow these guidelines to prevent infection:  Follow healthcare providers instructions Make sure that you understand and can help the patient follow any healthcare provider instructions for all care.  Provide for the patients basic needs You should help the patient with basic needs in the home and provide support for getting groceries, prescriptions, and other personal needs.  Monitor the patients symptoms If they are getting sicker, call his or her medical provider and tell them that the patient has, or is being evaluated for, COVID-19 infection. This will help the healthcare providers office take steps to keep other people from getting infected. Ask the healthcare provider to call the local or state health department.  Limit the number of people who have contact with the patient  If possible, have only one caregiver for the patient.  Other household members should stay in another home or place of residence. If this is not possible, they should stay  in another room, or be separated from the patient as much as possible. Use a separate bathroom, if available.  Restrict visitors who do not have an essential need to be in the home.  Keep older adults, very young children, and other sick people away from the patient Keep older adults, very young children, and those who have compromised immune systems or chronic health conditions away from the patient.  This includes people with chronic heart, lung, or kidney conditions, diabetes, and cancer.  Ensure good ventilation Make sure that shared spaces in the home have good air flow, such as from an air conditioner or an opened window, weather permitting.  Wash your hands often  Wash your hands often and thoroughly with soap and water for at least 20 seconds. You can use an alcohol based hand sanitizer if soap and water are not available and if your hands are not visibly dirty.  Avoid touching your eyes, nose, and mouth with unwashed hands.  Use disposable paper towels to dry your hands. If not available, use dedicated cloth towels and replace them when they become wet.  Wear a facemask and gloves  Wear a disposable facemask at all times in the room and gloves when you touch or have contact with the patients blood, body fluids, and/or secretions or excretions, such as sweat, saliva, sputum, nasal mucus, vomit, urine, or feces.  Ensure the mask fits over your nose and mouth tightly, and do not touch it during use.  Throw out disposable facemasks and gloves after using them. Do not reuse.  Wash your hands immediately after removing your facemask and gloves.  If your personal clothing becomes contaminated, carefully remove clothing and  launder. Wash your hands after handling contaminated clothing.  Place all used disposable facemasks, gloves, and other waste in a lined container before disposing them with other household waste.  Remove gloves and wash your hands immediately after handling these items.  Do not share dishes, glasses, or other household items with the patient  Avoid sharing household items. You should not share dishes, drinking glasses, cups, eating utensils, towels, bedding, or other items with a patient who is confirmed to have, or being evaluated for, COVID-19 infection.  After the person uses these items, you should wash them thoroughly with soap and water.  Wash laundry  thoroughly  Immediately remove and wash clothes or bedding that have blood, body fluids, and/or secretions or excretions, such as sweat, saliva, sputum, nasal mucus, vomit, urine, or feces, on them.  Wear gloves when handling laundry from the patient.  Read and follow directions on labels of laundry or clothing items and detergent. In general, wash and dry with the warmest temperatures recommended on the label.  Clean all areas the individual has used often  Clean all touchable surfaces, such as counters, tabletops, doorknobs, bathroom fixtures, toilets, phones, keyboards, tablets, and bedside tables, every day. Also, clean any surfaces that may have blood, body fluids, and/or secretions or excretions on them.  Wear gloves when cleaning surfaces the patient has come in contact with.  Use a diluted bleach solution (e.g., dilute bleach with 1 part bleach and 10 parts water) or a household disinfectant with a label that says EPA-registered for coronaviruses. To make a bleach solution at home, add 1 tablespoon of bleach to 1 quart (4 cups) of water. For a larger supply, add  cup of bleach to 1 gallon (16 cups) of water.  Read labels of cleaning products and follow recommendations provided on product labels. Labels contain instructions for safe and effective use of the cleaning product including precautions you should take when applying the product, such as wearing gloves or eye protection and making sure you have good ventilation during use of the product.  Remove gloves and wash hands immediately after cleaning.  Monitor yourself for signs and symptoms of illness Caregivers and household members are considered close contacts, should monitor their health, and will be asked to limit movement outside of the home to the extent possible. Follow the monitoring steps for close contacts listed on the symptom monitoring form.   ? If you have additional questions, contact your local health department  or call the epidemiologist on call at [phone] (available 24/7). ? This guidance is subject to change. For the most up-to-date guidance from [facility], please refer to their website: [email or web address]

## 2019-06-19 NOTE — Progress Notes (Signed)
CSW spoke with patients group home manager, Woodfin Ganja [phone], regarding discharge plans. Patient is able to return to group home with [facility] services. Group Home has no HH preference- RN CM notified.   Kingsley Spittle, LCSW [facility]  [phone]

## 2019-06-24 DIAGNOSIS — Z9181 History of falling: Secondary | ICD-10-CM | POA: Diagnosis not present

## 2019-06-24 DIAGNOSIS — U071 COVID-19: Secondary | ICD-10-CM | POA: Diagnosis not present

## 2019-06-24 DIAGNOSIS — E785 Hyperlipidemia, unspecified: Secondary | ICD-10-CM | POA: Diagnosis not present

## 2019-06-24 DIAGNOSIS — J1289 Other viral pneumonia: Secondary | ICD-10-CM | POA: Diagnosis not present

## 2019-06-24 DIAGNOSIS — Z7982 Long term (current) use of aspirin: Secondary | ICD-10-CM | POA: Diagnosis not present

## 2019-06-24 DIAGNOSIS — F79 Unspecified intellectual disabilities: Secondary | ICD-10-CM | POA: Diagnosis not present

## 2019-06-24 DIAGNOSIS — I10 Essential (primary) hypertension: Secondary | ICD-10-CM | POA: Diagnosis not present

## 2019-07-02 DIAGNOSIS — U071 COVID-19: Secondary | ICD-10-CM | POA: Diagnosis not present

## 2019-07-02 DIAGNOSIS — Z7982 Long term (current) use of aspirin: Secondary | ICD-10-CM | POA: Diagnosis not present

## 2019-07-02 DIAGNOSIS — F79 Unspecified intellectual disabilities: Secondary | ICD-10-CM | POA: Diagnosis not present

## 2019-07-02 DIAGNOSIS — J1289 Other viral pneumonia: Secondary | ICD-10-CM | POA: Diagnosis not present

## 2019-07-02 DIAGNOSIS — I10 Essential (primary) hypertension: Secondary | ICD-10-CM | POA: Diagnosis not present

## 2019-07-02 DIAGNOSIS — E785 Hyperlipidemia, unspecified: Secondary | ICD-10-CM | POA: Diagnosis not present

## 2019-07-03 DIAGNOSIS — Z9181 History of falling: Secondary | ICD-10-CM | POA: Diagnosis not present

## 2019-07-03 DIAGNOSIS — Z7982 Long term (current) use of aspirin: Secondary | ICD-10-CM | POA: Diagnosis not present

## 2019-07-03 DIAGNOSIS — J1289 Other viral pneumonia: Secondary | ICD-10-CM | POA: Diagnosis not present

## 2019-07-03 DIAGNOSIS — E785 Hyperlipidemia, unspecified: Secondary | ICD-10-CM | POA: Diagnosis not present

## 2019-07-03 DIAGNOSIS — F79 Unspecified intellectual disabilities: Secondary | ICD-10-CM | POA: Diagnosis not present

## 2019-07-03 DIAGNOSIS — I10 Essential (primary) hypertension: Secondary | ICD-10-CM | POA: Diagnosis not present

## 2019-07-03 DIAGNOSIS — U071 COVID-19: Secondary | ICD-10-CM | POA: Diagnosis not present

## 2019-07-08 DIAGNOSIS — J1281 Pneumonia due to SARS-associated coronavirus: Secondary | ICD-10-CM | POA: Diagnosis not present

## 2019-07-09 DIAGNOSIS — J1289 Other viral pneumonia: Secondary | ICD-10-CM | POA: Diagnosis not present

## 2019-07-09 DIAGNOSIS — I10 Essential (primary) hypertension: Secondary | ICD-10-CM | POA: Diagnosis not present

## 2019-07-09 DIAGNOSIS — Z23 Encounter for immunization: Secondary | ICD-10-CM | POA: Diagnosis not present

## 2019-07-09 DIAGNOSIS — Z7982 Long term (current) use of aspirin: Secondary | ICD-10-CM | POA: Diagnosis not present

## 2019-07-09 DIAGNOSIS — U071 COVID-19: Secondary | ICD-10-CM | POA: Diagnosis not present

## 2019-07-09 DIAGNOSIS — F79 Unspecified intellectual disabilities: Secondary | ICD-10-CM | POA: Diagnosis not present

## 2019-07-09 DIAGNOSIS — E785 Hyperlipidemia, unspecified: Secondary | ICD-10-CM | POA: Diagnosis not present

## 2019-07-10 DIAGNOSIS — F79 Unspecified intellectual disabilities: Secondary | ICD-10-CM | POA: Diagnosis not present

## 2019-07-10 DIAGNOSIS — Z7982 Long term (current) use of aspirin: Secondary | ICD-10-CM | POA: Diagnosis not present

## 2019-07-10 DIAGNOSIS — I10 Essential (primary) hypertension: Secondary | ICD-10-CM | POA: Diagnosis not present

## 2019-07-10 DIAGNOSIS — E785 Hyperlipidemia, unspecified: Secondary | ICD-10-CM | POA: Diagnosis not present

## 2019-07-10 DIAGNOSIS — U071 COVID-19: Secondary | ICD-10-CM | POA: Diagnosis not present

## 2019-07-10 DIAGNOSIS — J1289 Other viral pneumonia: Secondary | ICD-10-CM | POA: Diagnosis not present

## 2019-07-11 DIAGNOSIS — Z1231 Encounter for screening mammogram for malignant neoplasm of breast: Secondary | ICD-10-CM | POA: Diagnosis not present

## 2019-07-11 LAB — HM MAMMOGRAPHY: HM Mammogram: NORMAL (ref 0–4)

## 2019-07-15 DIAGNOSIS — F79 Unspecified intellectual disabilities: Secondary | ICD-10-CM | POA: Diagnosis not present

## 2019-07-15 DIAGNOSIS — Z7982 Long term (current) use of aspirin: Secondary | ICD-10-CM | POA: Diagnosis not present

## 2019-07-15 DIAGNOSIS — E785 Hyperlipidemia, unspecified: Secondary | ICD-10-CM | POA: Diagnosis not present

## 2019-07-15 DIAGNOSIS — U071 COVID-19: Secondary | ICD-10-CM | POA: Diagnosis not present

## 2019-07-15 DIAGNOSIS — J1289 Other viral pneumonia: Secondary | ICD-10-CM | POA: Diagnosis not present

## 2019-07-15 DIAGNOSIS — I10 Essential (primary) hypertension: Secondary | ICD-10-CM | POA: Diagnosis not present

## 2019-07-29 DIAGNOSIS — J189 Pneumonia, unspecified organism: Secondary | ICD-10-CM | POA: Diagnosis not present

## 2019-07-29 DIAGNOSIS — J9811 Atelectasis: Secondary | ICD-10-CM | POA: Diagnosis not present

## 2019-07-29 DIAGNOSIS — U071 COVID-19: Secondary | ICD-10-CM | POA: Diagnosis not present

## 2019-07-29 DIAGNOSIS — J1281 Pneumonia due to SARS-associated coronavirus: Secondary | ICD-10-CM | POA: Diagnosis not present

## 2019-08-15 DIAGNOSIS — Z20828 Contact with and (suspected) exposure to other viral communicable diseases: Secondary | ICD-10-CM | POA: Diagnosis not present

## 2019-10-02 DIAGNOSIS — Z111 Encounter for screening for respiratory tuberculosis: Secondary | ICD-10-CM | POA: Diagnosis not present

## 2019-10-02 DIAGNOSIS — Z6838 Body mass index (BMI) 38.0-38.9, adult: Secondary | ICD-10-CM | POA: Diagnosis not present

## 2019-10-02 DIAGNOSIS — H6123 Impacted cerumen, bilateral: Secondary | ICD-10-CM | POA: Diagnosis not present

## 2019-10-02 DIAGNOSIS — Z0000 Encounter for general adult medical examination without abnormal findings: Secondary | ICD-10-CM | POA: Diagnosis not present

## 2019-10-04 DIAGNOSIS — Z111 Encounter for screening for respiratory tuberculosis: Secondary | ICD-10-CM | POA: Diagnosis not present

## 2019-10-10 ENCOUNTER — Ambulatory Visit (INDEPENDENT_AMBULATORY_CARE_PROVIDER_SITE_OTHER): Payer: Medicare Other | Admitting: Family Medicine

## 2019-10-10 ENCOUNTER — Other Ambulatory Visit: Payer: Self-pay

## 2019-10-10 ENCOUNTER — Encounter: Payer: Self-pay | Admitting: Family Medicine

## 2019-10-10 VITALS — BP 140/88 | HR 84 | Temp 98.0°F | Resp 16 | Wt 192.0 lb

## 2019-10-10 DIAGNOSIS — B373 Candidiasis of vulva and vagina: Secondary | ICD-10-CM

## 2019-10-10 DIAGNOSIS — B3731 Acute candidiasis of vulva and vagina: Secondary | ICD-10-CM

## 2019-10-10 DIAGNOSIS — N3000 Acute cystitis without hematuria: Secondary | ICD-10-CM | POA: Diagnosis not present

## 2019-10-10 DIAGNOSIS — H1032 Unspecified acute conjunctivitis, left eye: Secondary | ICD-10-CM | POA: Diagnosis not present

## 2019-10-10 LAB — POCT URINALYSIS DIPSTICK
Bilirubin, UA: 0
Blood, UA: 0
Glucose, UA: NEGATIVE
Ketones, UA: 0
Nitrite, UA: POSITIVE
Protein, UA: NEGATIVE
Spec Grav, UA: 1.01
Urobilinogen, UA: 0.2 U/dL
pH, UA: 7

## 2019-10-10 MED ORDER — CIPROFLOXACIN HCL 500 MG PO TABS: 500.0000 mg | ORAL_TABLET | Freq: Two times a day (BID) | ORAL | 0 refills | Status: DC

## 2019-10-10 MED ORDER — FLUCONAZOLE 150 MG PO TABS: 150.0000 mg | ORAL_TABLET | Freq: Once | ORAL | 0 refills | Status: AC

## 2019-10-10 MED ORDER — BACITRACIN-POLYMYXIN B 500-10000 UNIT/GM OP OINT: 2.0000 "application " | TOPICAL_OINTMENT | Freq: Two times a day (BID) | OPHTHALMIC | 0 refills | Status: DC

## 2019-10-12 LAB — URINE CULTURE

## 2019-10-14 ENCOUNTER — Other Ambulatory Visit: Payer: Self-pay

## 2019-10-14 ENCOUNTER — Encounter: Payer: Self-pay | Admitting: Family Medicine

## 2019-10-14 DIAGNOSIS — H1032 Unspecified acute conjunctivitis, left eye: Secondary | ICD-10-CM | POA: Insufficient documentation

## 2019-10-14 DIAGNOSIS — N3000 Acute cystitis without hematuria: Secondary | ICD-10-CM | POA: Insufficient documentation

## 2019-10-14 DIAGNOSIS — B373 Candidiasis of vulva and vagina: Secondary | ICD-10-CM | POA: Insufficient documentation

## 2019-10-14 MED ORDER — NITROFURANTOIN MONOHYD MACRO 100 MG PO CAPS: 100.0000 mg | ORAL_CAPSULE | Freq: Two times a day (BID) | ORAL | 0 refills | Status: AC

## 2019-10-14 NOTE — Assessment & Plan Note (Signed)
Eye drops sent -see rx.

## 2019-10-14 NOTE — Assessment & Plan Note (Signed)
cipro I po bid x 7 days.

## 2019-10-14 NOTE — Progress Notes (Signed)
Acute Office Visit  Subjective:    Patient ID: Lisa Montoya, female    DOB: 09-Feb-1956, 64 y.o.   MRN: [id number]  Chief Complaint  Patient presents with  . Dysuria    HPI Patient is in today for Urinary Tract Infection: Patient complains of burning with urination She has had symptoms for 1 day. Patient also complains of vaginal discharge and vaginal itching.. Patient denies back pain, abdominal pain, or nausea. Patient is nonverbal, but is able to respond to questions.  Past Medical History:  Diagnosis Date  . Essential hypertension   . Mixed hyperlipidemia   . Morbid obesity due to excess calories ([place])   . Pneumonia due to SARS-associated coronavirus     History reviewed. No pertinent surgical history.  Family History  Family history unknown: Yes    Social History   Socioeconomic History  . Marital status: Single    Spouse name: Not on file  . Number of children: Not on file  . Years of education: Not on file  . Highest education level: Not on file  Occupational History  . Not on file  Tobacco Use  . Smoking status: Never Smoker  . Smokeless tobacco: Never Used  Substance and Sexual Activity  . Alcohol use: Never  . Drug use: Never  . Sexual activity: Not Currently  Other Topics Concern  . Not on file  Social History Narrative  . Not on file   Social Determinants of Health   Financial Resource Strain:   . Difficulty of Paying Living Expenses: Not on file  Food Insecurity:   . Worried About Charity fundraiser in the Last Year: Not on file  . Ran Out of Food in the Last Year: Not on file  Transportation Needs:   . Lack of Transportation (Medical): Not on file  . Lack of Transportation (Non-Medical): Not on file  Physical Activity:   . Days of Exercise per Week: Not on file  . Minutes of Exercise per Session: Not on file  Stress:   . Feeling of Stress : Not on file  Social Connections:   . Frequency of Communication with Friends and Family: Not  on file  . Frequency of Social Gatherings with Friends and Family: Not on file  . Attends Religious Services: Not on file  . Active Member of Clubs or Organizations: Not on file  . Attends Archivist Meetings: Not on file  . Marital Status: Not on file  Intimate Partner Violence:   . Fear of Current or Ex-Partner: Not on file  . Emotionally Abused: Not on file  . Physically Abused: Not on file  . Sexually Abused: Not on file    Outpatient Medications Prior to Visit  Medication Sig Dispense Refill  . acetaminophen (TYLENOL) 500 MG tablet Take 1,000 mg by mouth every 6 (six) hours as needed for mild pain.    Marland Kitchen alum & mag hydroxide-simeth (ANTACID ANTI-GAS REG STRENGTH) 200-200-20 MG/5ML suspension Take 10 mLs by mouth every 4 (four) hours as needed for indigestion or heartburn.    Marland Kitchen aspirin EC 81 MG tablet Take 81 mg by mouth daily.    Marland Kitchen atorvastatin (LIPITOR) 10 MG tablet Take 10 mg by mouth daily.    . bisoprolol-hydrochlorothiazide (ZIAC) 10-6.25 MG tablet Take 0.5 tablets by mouth daily.    . carbamide peroxide (DEBROX) 6.5 % OTIC solution Place 5-10 drops into both ears 2 (two) times daily as needed (for removal of ear wax for  ten days).    . chlorhexidine (PERIDEX) 0.12 % solution Use as directed 15 mLs in the mouth or throat 2 (two) times daily.    . clotrimazole (LOTRIMIN) 1 % cream Apply 1 application topically daily as needed (ringworm, athlete's foot, jock/groin itch).    . Diethyltoluamide (OFF SMOOTH & DRY EX) Apply 1 application topically as needed (for insect repellant on outings).    . diphenhydrAMINE (BENADRYL) 25 MG tablet Take 25 mg by mouth 3 (three) times daily as needed for allergies.    . hydrogen peroxide 3 % external solution Apply 1 application topically as needed (wound cleaning).    Marland Kitchen lip balm (BLISTEX) OINT Apply 1 application topically as needed for lip care.    . loperamide (IMODIUM A-D) 2 MG tablet Take 4 mg by mouth 4 (four) times daily as needed  for diarrhea or loose stools.    . magnesium hydroxide (MILK OF MAGNESIA) 400 MG/5ML suspension Take 30 mLs by mouth daily as needed for mild constipation.    . Multiple Vitamin (MULTIVITAMIN) tablet Take 1 tablet by mouth daily.    Marland Kitchen neomycin-bacitracin-polymyxin (NEOSPORIN) [id number] ointment Apply 1 application topically 3 (three) times daily as needed (wound care or skin lesions).    . Phenol-Phenolate Sodium (THROAT SPRAY MT) Use as directed 2 sprays in the mouth or throat as needed (five times daily for sore throat).    . polyvinyl alcohol (LIQUIFILM TEARS) 1.4 % ophthalmic solution Place 1-2 drops into both eyes as needed for dry eyes.    Marland Kitchen selenium sulfide (SELSUN) 1 % LOTN Apply 1 application topically as needed for irritation.    . sodium chloride (OCEAN) 0.65 % SOLN nasal spray Place 1-2 sprays into both nostrils as needed for congestion.    . Sunscreens (SUNBLOCK SPF30 EX) Apply 1 application topically as needed (to protect from sunburn on outings).    . timolol (BETIMOL) 0.25 % ophthalmic solution Place 1-2 drops into both eyes 2 (two) times daily.     No facility-administered medications prior to visit.    No Known Allergies  Review of Systems  Constitutional: Negative for chills, fatigue and fever.  HENT: Negative for congestion, ear pain and sore throat.   Eyes: Positive for discharge.       Clear drainage. Mild erythema. Pt was rubbing her right eye.   Respiratory: Negative for cough and shortness of breath.   Cardiovascular: Negative for chest pain.  Gastrointestinal: Negative for abdominal pain, constipation, diarrhea, nausea and vomiting.  Endocrine: Negative for polyuria.  Genitourinary: Positive for dysuria. Negative for hematuria and urgency.  Musculoskeletal: Negative for arthralgias and myalgias.  Neurological: Negative for dizziness and headaches.  Psychiatric/Behavioral: Negative for dysphoric mood. The patient is not nervous/anxious.           Objective:     Physical Exam Constitutional:      Appearance: Normal appearance.  Cardiovascular:     Rate and Rhythm: Normal rate and regular rhythm.  Pulmonary:     Effort: Pulmonary effort is normal.     Breath sounds: Normal breath sounds.  Abdominal:     General: Bowel sounds are normal. There is no distension.     Tenderness: There is no abdominal tenderness.  Neurological:     Mental Status: She is alert.     BP 140/88 (BP Location: Left Arm, Patient Position: Sitting, Cuff Size: Normal)   Pulse 84   Temp 98 F (36.7 C)   Resp 16   Wt 192  lb (87.1 kg)   BMI 37.50 kg/m  Wt Readings from Last 3 Encounters:  10/10/19 192 lb (87.1 kg)  06/14/19 196 lb 3.4 oz (89 kg)      Health Maintenance Due  Topic Date Due  . Hepatitis C Screening  [birth year]  . TETANUS/TDAP  11/24/1975  . PAP SMEAR-Modifier  11/23/1977  . MAMMOGRAM  11/24/2006  . COLONOSCOPY  11/24/2006  . INFLUENZA VACCINE  04/06/2019    There are no preventive care reminders to display for this patient.   No results found for: TSH Lab Results  Component Value Date   WBC 10.6 (H) 06/16/2019   HGB 13.8 06/16/2019   HCT 40.8 06/16/2019   MCV 92.7 06/16/2019   PLT 289 06/16/2019   Lab Results  Component Value Date   NA 141 06/16/2019   K 4.0 06/16/2019   CO2 25 06/16/2019   GLUCOSE 139 (H) 06/16/2019   BUN 17 06/16/2019   CREATININE 0.70 06/18/2019   BILITOT 0.8 06/16/2019   ALKPHOS 45 06/16/2019   AST 26 06/16/2019   ALT 26 06/16/2019   PROT 6.1 (L) 06/16/2019   ALBUMIN 3.0 (L) 06/16/2019   CALCIUM 8.4 (L) 06/16/2019   ANIONGAP 10 06/16/2019   No results found for: CHOL No results found for: HDL No results found for: LDLCALC No results found for: TRIG No results found for: CHOLHDL No results found for: MWNU2V     Assessment & Plan:   Problem List Items Addressed This Visit      Genitourinary   Acute cystitis without hematuria - Primary   Relevant Orders   POCT urinalysis dipstick  (Completed)   Urine Culture (Completed)   Candida vaginitis       Meds ordered this encounter  Medications  . ciprofloxacin (CIPRO) 500 MG tablet    Sig: Take 1 tablet (500 mg total) by mouth 2 (two) times daily.    Dispense:  14 tablet    Refill:  0  . fluconazole (DIFLUCAN) 150 MG tablet    Sig: Take 1 tablet (150 mg total) by mouth once for 1 dose.    Dispense:  1 tablet    Refill:  0  . bacitracin-polymyxin b (POLYSPORIN) ophthalmic ointment    Sig: Place 2 application into the left eye every 12 (twelve) hours. apply to eye every 12 hours while awake    Dispense:  3.5 g    Refill:  0     Blane Ohara, MD

## 2019-10-14 NOTE — Assessment & Plan Note (Signed)
Fluconazole 150 mg once daily.

## 2019-10-31 ENCOUNTER — Other Ambulatory Visit: Payer: Self-pay | Admitting: Family Medicine

## 2019-11-16 IMAGING — DX DG CHEST 1V PORT SAME DAY
1 series · 1 of 1 positions shown · non-contrast
Comparison: June 11, 2019.

CLINICAL DATA: Shortness of breath.

EXAM:
PORTABLE CHEST 1 VIEW

[chest]
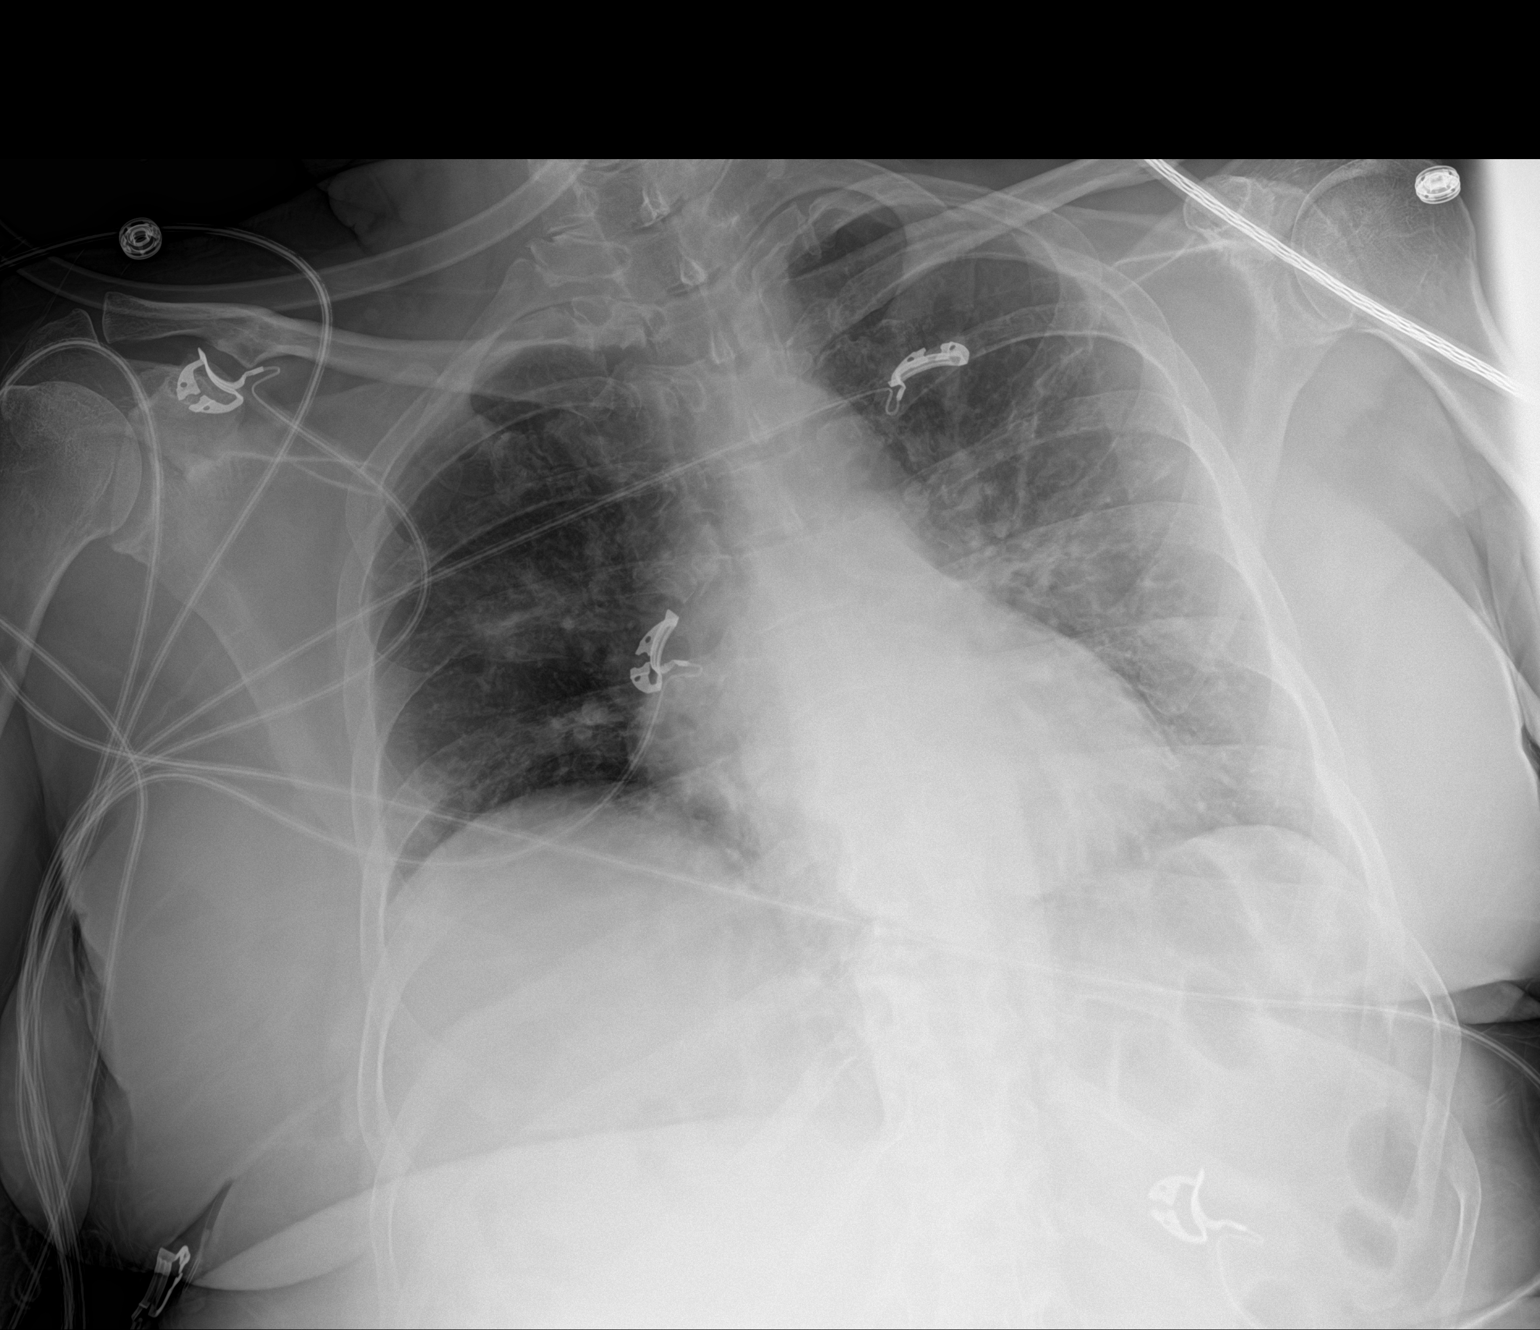

[1 of 1 positions shown; findings below may reference images not displayed]

FINDINGS: Stable cardiomediastinal silhouette. No pneumothorax or pleural
effusion is noted. Mild bilateral perihilar opacities are noted
concerning for multifocal pneumonia as described on prior CT scan.
Bony thorax is unremarkable.
IMPRESSION: Bilateral perihilar opacities are noted concerning for multifocal
pneumonia as described on prior CT scan.

## 2019-12-10 DIAGNOSIS — J101 Influenza due to other identified influenza virus with other respiratory manifestations: Secondary | ICD-10-CM | POA: Diagnosis not present

## 2019-12-10 DIAGNOSIS — B974 Respiratory syncytial virus as the cause of diseases classified elsewhere: Secondary | ICD-10-CM | POA: Diagnosis not present

## 2019-12-10 DIAGNOSIS — Z20828 Contact with and (suspected) exposure to other viral communicable diseases: Secondary | ICD-10-CM | POA: Diagnosis not present

## 2019-12-10 DIAGNOSIS — U071 COVID-19: Secondary | ICD-10-CM | POA: Diagnosis not present

## 2020-01-02 NOTE — Progress Notes (Signed)
Cancelled.  

## 2020-01-06 ENCOUNTER — Ambulatory Visit (INDEPENDENT_AMBULATORY_CARE_PROVIDER_SITE_OTHER): Payer: Medicare Other | Admitting: Family Medicine

## 2020-01-06 DIAGNOSIS — I10 Essential (primary) hypertension: Secondary | ICD-10-CM

## 2020-01-06 DIAGNOSIS — E782 Mixed hyperlipidemia: Secondary | ICD-10-CM

## 2020-01-14 NOTE — Progress Notes (Signed)
Subjective:  Patient ID: Lisa Montoya, female    DOB: [birth year]  Age: 64 y.o. MRN: [id number]  Chief Complaint  Patient presents with  . Hyperlipidemia  . Hypertension    HPI Patient is a 64 year old mentally disabled white female who presents for follow-up of her chronic medical issues.  She has poor verbal abilities.  She essentially says yes to everything.  For her hypertension she is currently on bisoprolol-hydrochlorothiazide.  For hyperlipidemia she is on Lipitor 10 mg once daily.  She stays at a group home.  They do try to provide a healthy diet and regular exercise. Her caregiver is with her today and has no concerns at this time.  I did notice pt has had ferritin levels in the past which have been elevated, but no hemachromatosis work up completed.  Past Medical History:  Diagnosis Date  . Essential hypertension   . Mental disability   . Mixed hyperlipidemia   . Morbid obesity due to excess calories (HCC)   . Pneumonia due to SARS-associated coronavirus    No past surgical history on file.  Family History  Family history unknown: Yes   Social History   Socioeconomic History  . Marital status: Single    Spouse name: Not on file  . Number of children: Not on file  . Years of education: Not on file  . Highest education level: Not on file  Occupational History  . Not on file  Tobacco Use  . Smoking status: Never Smoker  . Smokeless tobacco: Never Used  Substance and Sexual Activity  . Alcohol use: Never  . Drug use: Never  . Sexual activity: Not Currently  Other Topics Concern  . Not on file  Social History Narrative  . Not on file   Social Determinants of Health   Financial Resource Strain:   . Difficulty of Paying Living Expenses:   Food Insecurity:   . Worried About Programme researcher, broadcasting/film/video in the Last Year:   . Barista in the Last Year:   Transportation Needs:   . Freight forwarder (Medical):   Marland Kitchen Lack of Transportation (Non-Medical):     Physical Activity:   . Days of Exercise per Week:   . Minutes of Exercise per Session:   Stress:   . Feeling of Stress :   Social Connections:   . Frequency of Communication with Friends and Family:   . Frequency of Social Gatherings with Friends and Family:   . Attends Religious Services:   . Active Member of Clubs or Organizations:   . Attends Banker Meetings:   Marland Kitchen Marital Status:     Review of Systems  Constitutional: Negative for chills, fatigue and fever.  HENT: Negative for congestion, ear pain, rhinorrhea and sore throat.   Respiratory: Negative for cough and shortness of breath.   Cardiovascular: Negative for chest pain.  Gastrointestinal: Negative for abdominal pain, constipation, diarrhea, nausea and vomiting.  Genitourinary: Negative for dysuria and urgency.  Musculoskeletal: Negative for back pain and myalgias.  Neurological: Negative for dizziness, weakness, light-headedness and headaches.  Psychiatric/Behavioral: Negative for dysphoric mood. The patient is not nervous/anxious.      Objective:  BP 130/82   Pulse 72   Temp (!) 97.5 F (36.4 C)   Resp 16   Ht 5' (1.524 m)   Wt 197 lb (89.4 kg)   BMI 38.47 kg/m   BP/Weight 01/16/2020 10/10/2019 06/19/2019  Systolic BP 130 140 109  Diastolic  BP 82 88 68  Wt. (Lbs) 197 192 -  BMI 38.47 37.5 -    Physical Exam Vitals reviewed.  Constitutional:      Appearance: Normal appearance. She is obese.  HENT:     Right Ear: There is impacted cerumen.     Left Ear: There is impacted cerumen.     Nose: Nose normal.     Mouth/Throat:     Mouth: Mucous membranes are moist.     Pharynx: No oropharyngeal exudate or posterior oropharyngeal erythema.  Neck:     Vascular: No carotid bruit.  Cardiovascular:     Rate and Rhythm: Normal rate and regular rhythm.     Pulses: Normal pulses.     Heart sounds: Normal heart sounds.  Pulmonary:     Effort: Pulmonary effort is normal. No respiratory distress.      Breath sounds: Normal breath sounds.  Abdominal:     General: Abdomen is flat. Bowel sounds are normal.     Palpations: Abdomen is soft.     Tenderness: There is no abdominal tenderness.  Neurological:     Mental Status: She is alert. Mental status is at baseline.  Psychiatric:        Mood and Affect: Mood normal.        Behavior: Behavior normal.     Diabetic Foot Exam - Simple   No data filed       Lab Results  Component Value Date   WBC 10.6 (H) 06/16/2019   HGB 13.8 06/16/2019   HCT 40.8 06/16/2019   PLT 289 06/16/2019   GLUCOSE 139 (H) 06/16/2019   ALT 26 06/16/2019   AST 26 06/16/2019   NA 141 06/16/2019   K 4.0 06/16/2019   CL 106 06/16/2019   CREATININE 0.70 06/18/2019   BUN 17 06/16/2019   CO2 25 06/16/2019      Assessment & Plan:   1. Essential hypertension Well controlled.  No changes to medicines.  Continue to work on eating a healthy diet and exercise.  Labs drawn today.  - CBC with Differential/Platelet; Future - Comprehensive metabolic panel; Future - TSH; Future  2. Mixed hyperlipidemia Well controlled.  No changes to medicines.  Continue to work on eating a healthy diet and exercise.  Labs drawn today.  - Lipid panel; Future  3. Elevated ferritin - Ferritin; Future - Hemochromatosis DNA-PCR(c282y,h63d); Future  4. Bilateral impacted cerumen - Ear Lavage - successfully cleared both ears of wax with irrigation.  - Discontinue debrox drops.      Orders Placed This Encounter  Procedures  . CBC with Differential/Platelet  . Comprehensive metabolic panel  . Lipid panel  . TSH  . Ferritin  . Hemochromatosis DNA-PCR(c282y,h63d)  . Ear Lavage     Follow-up: Return in about 4 months (around 05/18/2020) for not fasting.  An After Visit Summary was printed and given to the patient.  Rochel Brome [facility] [phone]

## 2020-01-16 ENCOUNTER — Ambulatory Visit (INDEPENDENT_AMBULATORY_CARE_PROVIDER_SITE_OTHER): Payer: Medicare Other | Admitting: Family Medicine

## 2020-01-16 ENCOUNTER — Other Ambulatory Visit: Payer: Self-pay

## 2020-01-16 VITALS — BP 130/82 | HR 72 | Temp 97.5°F | Resp 16 | Ht 60.0 in | Wt 197.0 lb

## 2020-01-16 DIAGNOSIS — H6123 Impacted cerumen, bilateral: Secondary | ICD-10-CM | POA: Insufficient documentation

## 2020-01-16 DIAGNOSIS — E782 Mixed hyperlipidemia: Secondary | ICD-10-CM | POA: Diagnosis not present

## 2020-01-16 DIAGNOSIS — I10 Essential (primary) hypertension: Secondary | ICD-10-CM

## 2020-01-16 DIAGNOSIS — R7989 Other specified abnormal findings of blood chemistry: Secondary | ICD-10-CM | POA: Insufficient documentation

## 2020-01-16 NOTE — Patient Instructions (Signed)
Stop Debrox ear drops.  Return for lab work fasting in next 1-2 weeks.

## 2020-01-23 ENCOUNTER — Other Ambulatory Visit: Payer: Medicare Other

## 2020-01-23 ENCOUNTER — Other Ambulatory Visit: Payer: Self-pay

## 2020-01-23 DIAGNOSIS — I10 Essential (primary) hypertension: Secondary | ICD-10-CM

## 2020-01-23 DIAGNOSIS — E782 Mixed hyperlipidemia: Secondary | ICD-10-CM | POA: Diagnosis not present

## 2020-01-23 DIAGNOSIS — R7989 Other specified abnormal findings of blood chemistry: Secondary | ICD-10-CM | POA: Diagnosis not present

## 2020-01-26 ENCOUNTER — Encounter: Payer: Self-pay | Admitting: Family Medicine

## 2020-01-27 LAB — CBC WITH DIFFERENTIAL/PLATELET
Basophils Absolute: 0 10*3/uL (ref 0.0–0.2)
Basos: 0 %
EOS (ABSOLUTE): 0.1 10*3/uL (ref 0.0–0.4)
Eos: 2 %
Hematocrit: 43.9 % (ref 34.0–46.6)
Hemoglobin: 14.7 g/dL (ref 11.1–15.9)
Immature Grans (Abs): 0 10*3/uL (ref 0.0–0.1)
Immature Granulocytes: 0 %
Lymphocytes Absolute: 1.2 10*3/uL (ref 0.7–3.1)
Lymphs: 23 %
MCH: 31.8 pg (ref 26.6–33.0)
MCHC: 33.5 g/dL (ref 31.5–35.7)
MCV: 95 fL (ref 79–97)
Monocytes Absolute: 0.4 10*3/uL (ref 0.1–0.9)
Monocytes: 8 %
Neutrophils Absolute: 3.6 10*3/uL (ref 1.4–7.0)
Neutrophils: 67 %
Platelets: 223 10*3/uL (ref 150–450)
RBC: 4.62 x10E6/uL (ref 3.77–5.28)
RDW: 12.8 % (ref 11.7–15.4)
WBC: 5.4 10*3/uL (ref 3.4–10.8)

## 2020-01-27 LAB — COMPREHENSIVE METABOLIC PANEL
ALT: 20 IU/L (ref 0–32)
AST: 24 IU/L (ref 0–40)
Albumin/Globulin Ratio: 1.5 (ref 1.2–2.2)
Albumin: 4.1 g/dL (ref 3.8–4.8)
Alkaline Phosphatase: 95 IU/L (ref 48–121)
BUN/Creatinine Ratio: 12 (ref 12–28)
BUN: 9 mg/dL (ref 8–27)
Bilirubin Total: 0.4 mg/dL (ref 0.0–1.2)
CO2: 26 mmol/L (ref 20–29)
Calcium: 10.1 mg/dL (ref 8.7–10.3)
Chloride: 101 mmol/L (ref 96–106)
Creatinine, Ser: 0.77 mg/dL (ref 0.57–1.00)
GFR calc Af Amer: 95 mL/min/{1.73_m2} (ref >59)
GFR calc non Af Amer: 82 mL/min/{1.73_m2} (ref >59)
Globulin, Total: 2.8 g/dL (ref 1.5–4.5)
Glucose: 99 mg/dL (ref 65–99)
Potassium: 4.3 mmol/L (ref 3.5–5.2)
Sodium: 142 mmol/L (ref 134–144)
Total Protein: 6.9 g/dL (ref 6.0–8.5)

## 2020-01-27 LAB — LIPID PANEL
Chol/HDL Ratio: 2.2 ratio (ref 0.0–4.4)
Cholesterol, Total: 154 mg/dL (ref 100–199)
HDL: 71 mg/dL (ref >39)
LDL Chol Calc (NIH): 69 mg/dL (ref 0–99)
Triglycerides: 73 mg/dL (ref 0–149)
VLDL Cholesterol Cal: 14 mg/dL (ref 5–40)

## 2020-01-27 LAB — HEMOCHROMATOSIS DNA-PCR(C282Y,H63D)

## 2020-01-27 LAB — CARDIOVASCULAR RISK ASSESSMENT

## 2020-01-27 LAB — TSH: TSH: 1.75 u[IU]/mL (ref 0.450–4.500)

## 2020-01-27 LAB — FERRITIN: Ferritin: 56 ng/mL (ref 15–150)

## 2020-02-04 ENCOUNTER — Other Ambulatory Visit: Payer: Self-pay

## 2020-02-25 ENCOUNTER — Other Ambulatory Visit: Payer: Self-pay

## 2020-02-25 ENCOUNTER — Ambulatory Visit (INDEPENDENT_AMBULATORY_CARE_PROVIDER_SITE_OTHER): Payer: Medicare Other | Admitting: Legal Medicine

## 2020-02-25 ENCOUNTER — Encounter: Payer: Self-pay | Admitting: Legal Medicine

## 2020-02-25 VITALS — BP 120/80 | HR 60 | Temp 97.7°F | Resp 18 | Ht 58.5 in | Wt 199.0 lb

## 2020-02-25 DIAGNOSIS — I10 Essential (primary) hypertension: Secondary | ICD-10-CM

## 2020-02-25 DIAGNOSIS — W19XXXA Unspecified fall, initial encounter: Secondary | ICD-10-CM

## 2020-02-25 DIAGNOSIS — R296 Repeated falls: Secondary | ICD-10-CM | POA: Diagnosis not present

## 2020-02-25 DIAGNOSIS — S2020XA Contusion of thorax, unspecified, initial encounter: Secondary | ICD-10-CM | POA: Insufficient documentation

## 2020-02-25 DIAGNOSIS — E66812 Obesity, class 2: Secondary | ICD-10-CM | POA: Insufficient documentation

## 2020-02-25 DIAGNOSIS — Z6841 Body Mass Index (BMI) 40.0 and over, adult: Secondary | ICD-10-CM | POA: Diagnosis not present

## 2020-02-25 LAB — POCT URINALYSIS DIP (CLINITEK)
Bilirubin, UA: NEGATIVE
Blood, UA: NEGATIVE
Glucose, UA: NEGATIVE mg/dL
Ketones, POC UA: NEGATIVE mg/dL
Leukocytes, UA: NEGATIVE
Nitrite, UA: NEGATIVE
POC PROTEIN,UA: NEGATIVE
Spec Grav, UA: 1.015 (ref 1.010–1.025)
Urobilinogen, UA: 0.2 E.U./dL
pH, UA: 6.5 (ref 5.0–8.0)

## 2020-02-25 NOTE — Progress Notes (Signed)
Acute Office Visit  Subjective:    Patient ID: Lisa Montoya, female    DOB: 12-18-1955, 64 y.o.   MRN: [id number]  Chief Complaint  Patient presents with  . Fall    Patient fell down outside after step out of the concrete  . Hip Pain    Right Hip Pain    HPI Patient is in today for recurrent falls, she fell in [place] missing step and fell in bathroom last night , she went to emergency room with bruised to hip.  X-rays negative for fractures.  Hospital records reviewed and all x-rays reviewed.  Past Medical History:  Diagnosis Date  . Essential hypertension   . Mental disability   . Mixed hyperlipidemia   . Morbid obesity due to excess calories (HCC)   . Pneumonia due to SARS-associated coronavirus     No past surgical history on file.  Family History  Family history unknown: Yes    Social History   Socioeconomic History  . Marital status: Single    Spouse name: Not on file  . Number of children: Not on file  . Years of education: Not on file  . Highest education level: Not on file  Occupational History  . Not on file  Tobacco Use  . Smoking status: Never Smoker  . Smokeless tobacco: Never Used  Substance and Sexual Activity  . Alcohol use: Never  . Drug use: Never  . Sexual activity: Not Currently  Other Topics Concern  . Not on file  Social History Narrative  . Not on file   Social Determinants of Health   Financial Resource Strain:   . Difficulty of Paying Living Expenses:   Food Insecurity:   . Worried About Programme researcher, broadcasting/film/video in the Last Year:   . Barista in the Last Year:   Transportation Needs:   . Freight forwarder (Medical):   Marland Kitchen Lack of Transportation (Non-Medical):   Physical Activity:   . Days of Exercise per Week:   . Minutes of Exercise per Session:   Stress:   . Feeling of Stress :   Social Connections:   . Frequency of Communication with Friends and Family:   . Frequency of Social Gatherings with Friends  and Family:   . Attends Religious Services:   . Active Member of Clubs or Organizations:   . Attends Banker Meetings:   Marland Kitchen Marital Status:   Intimate Partner Violence:   . Fear of Current or Ex-Partner:   . Emotionally Abused:   Marland Kitchen Physically Abused:   . Sexually Abused:     Outpatient Medications Prior to Visit  Medication Sig Dispense Refill  . acetaminophen (TYLENOL) 500 MG tablet Take 1,000 mg by mouth every 6 (six) hours as needed for mild pain.    Marland Kitchen alum & mag hydroxide-simeth (ANTACID ANTI-GAS REG STRENGTH) 200-200-20 MG/5ML suspension Take 10 mLs by mouth every 4 (four) hours as needed for indigestion or heartburn.    Marland Kitchen aspirin EC 81 MG tablet Take 81 mg by mouth daily.    Marland Kitchen atorvastatin (LIPITOR) 10 MG tablet Take 10 mg by mouth daily.    . bacitracin-polymyxin b (POLYSPORIN) ophthalmic ointment Place 2 application into the left eye every 12 (twelve) hours. apply to eye every 12 hours while awake 3.5 g 0  . bisoprolol-hydrochlorothiazide (ZIAC) 10-6.25 MG tablet Take 0.5 tablets by mouth daily.    . carbamide peroxide (DEBROX) 6.5 % OTIC solution Place 5-10  drops into both ears 2 (two) times daily as needed (for removal of ear wax for ten days).    . chlorhexidine (PERIDEX) 0.12 % solution SWISH & SPIT 1/2 OZ BY MOUTH FOR 30 SECONDS TWICE A DAY **NO REFILLS** 473 mL 10  . clotrimazole (LOTRIMIN) 1 % cream Apply 1 application topically daily as needed (ringworm, athlete's foot, jock/groin itch).    . Diethyltoluamide (OFF SMOOTH & DRY EX) Apply 1 application topically as needed (for insect repellant on outings).    . diphenhydrAMINE (BENADRYL) 25 MG tablet Take 25 mg by mouth 3 (three) times daily as needed for allergies.    . hydrogen peroxide 3 % external solution Apply 1 application topically as needed (wound cleaning).    Marland Kitchen lip balm (BLISTEX) OINT Apply 1 application topically as needed for lip care.    . loperamide (IMODIUM A-D) 2 MG tablet Take 4 mg by mouth 4  (four) times daily as needed for diarrhea or loose stools.    . magnesium hydroxide (MILK OF MAGNESIA) 400 MG/5ML suspension Take 30 mLs by mouth daily as needed for mild constipation.    . Multiple Vitamin (MULTIVITAMIN) tablet Take 1 tablet by mouth daily.    Marland Kitchen neomycin-bacitracin-polymyxin (NEOSPORIN) [phone] ointment Apply 1 application topically 3 (three) times daily as needed (wound care or skin lesions).    . Phenol-Phenolate Sodium (THROAT SPRAY MT) Use as directed 2 sprays in the mouth or throat as needed (five times daily for sore throat).    . polyvinyl alcohol (LIQUIFILM TEARS) 1.4 % ophthalmic solution Place 1-2 drops into both eyes as needed for dry eyes.    Marland Kitchen selenium sulfide (SELSUN) 1 % LOTN Apply 1 application topically as needed for irritation.    . sodium chloride (OCEAN) 0.65 % SOLN nasal spray Place 1-2 sprays into both nostrils as needed for congestion.    . Sunscreens (SUNBLOCK KZS01 EX) Apply 1 application topically as needed (to protect from sunburn on outings).    . timolol (BETIMOL) 0.25 % ophthalmic solution Place 1-2 drops into both eyes 2 (two) times daily.     No facility-administered medications prior to visit.    No Known Allergies  Review of Systems  Constitutional: Negative.   HENT: Negative.   Eyes: Negative.   Respiratory: Negative.   Cardiovascular: Negative.   Gastrointestinal: Negative.   Endocrine: Negative.   Genitourinary: Negative.   Musculoskeletal: Positive for back pain.  Skin: Negative.   Neurological: Negative.   Psychiatric/Behavioral: Negative.        Objective:    Physical Exam Vitals reviewed.  Constitutional:      Appearance: Normal appearance.  HENT:     Head: Normocephalic and atraumatic.     Right Ear: Tympanic membrane normal.     Left Ear: Tympanic membrane normal.     Nose: Nose normal.     Mouth/Throat:     Mouth: Mucous membranes are dry.  Cardiovascular:     Rate and Rhythm: Normal rate and regular  rhythm.     Pulses: Normal pulses.     Heart sounds: Normal heart sounds.  Pulmonary:     Effort: Pulmonary effort is normal.     Breath sounds: Normal breath sounds.  Musculoskeletal:     Cervical back: Normal range of motion.     Comments: Bruise left hip  Skin:    General: Skin is warm.  Neurological:     General: No focal deficit present.     BP 120/80  Pulse 60   Temp 97.7 F (36.5 C) (Temporal)   Resp 18   Ht 4' 10.5" (1.486 m)   Wt 199 lb (90.3 kg)   BMI 40.88 kg/m  Wt Readings from Last 3 Encounters:  02/25/20 199 lb (90.3 kg)  01/16/20 197 lb (89.4 kg)  10/10/19 192 lb (87.1 kg)    Health Maintenance Due  Topic Date Due  . Hepatitis C Screening  Never done  . COVID-19 Vaccine (1) Never done    There are no preventive care reminders to display for this patient.   Lab Results  Component Value Date   TSH 1.750 01/23/2020   Lab Results  Component Value Date   WBC 5.4 01/23/2020   HGB 14.7 01/23/2020   HCT 43.9 01/23/2020   MCV 95 01/23/2020   PLT 223 01/23/2020   Lab Results  Component Value Date   NA 142 01/23/2020   K 4.3 01/23/2020   CO2 26 01/23/2020   GLUCOSE 99 01/23/2020   BUN 9 01/23/2020   CREATININE 0.77 01/23/2020   BILITOT 0.4 01/23/2020   ALKPHOS 95 01/23/2020   AST 24 01/23/2020   ALT 20 01/23/2020   PROT 6.9 01/23/2020   ALBUMIN 4.1 01/23/2020   CALCIUM 10.1 01/23/2020   ANIONGAP 10 06/16/2019   Lab Results  Component Value Date   CHOL 154 01/23/2020   Lab Results  Component Value Date   HDL 71 01/23/2020   Lab Results  Component Value Date   LDLCALC 69 01/23/2020   Lab Results  Component Value Date   TRIG 73 01/23/2020   Lab Results  Component Value Date   CHOLHDL 2.2 01/23/2020   No results found for: HGBA1C     Assessment & Plan:  1. Fall, initial encounter - CBC with Differential/Platelet - Comprehensive metabolic panel - POCT URINALYSIS DIP (CLINITEK) Patient has been having more falls, no UTI  or infection found.  She has trouble picking feet up for curbing  2. Essential hypertension - CBC with Differential/Platelet - Comprehensive metabolic panel - POCT URINALYSIS DIP (CLINITEK) An individual hypertension care plan was established and reinforced today.  The patient's status was assessed using clinical findings on exam and labs or diagnostic tests. The patient's success at meeting treatment goals on disease specific evidence-based guidelines and found to be well controlled. SELF MANAGEMENT: The patient and I together assessed ways to personally work towards obtaining the recommended goals. RECOMMENDATIONS: avoid decongestants found in common cold remedies, decrease consumption of alcohol, perform routine monitoring of BP with home BP cuff, exercise, reduction of dietary salt, take medicines as prescribed, try not to miss doses and quit smoking.  Regular exercise and maintaining a healthy weight is needed.  Stress reduction may help. A CLINICAL SUMMARY including written plan identify barriers to care unique to individual due to social or financial issues.  We attempt to mutually creat solutions for individual and family understanding..  3. Falls frequently We discussed fall prevention, we discussed a walker or other device to help prevent falling  4. BMI 40.0-44.9, adult [facility]) An individualize plan was formulated for obesity using patient history and physical exam to encourage weight loss.  An evidence based program was formulated.  Patient is to cut portion size with meals and to plan physical exercise 3 days a week at least 20 minutes.  Weight watchers and other programs are helpful.  Planned amount of weight loss 5 lbs.  5. Morbid obesity (HCC) An individualize plan was formulated for obesity  using patient history and physical exam to encourage weight loss.  An evidence based program was formulated.  Patient is to cut portion size with meals and to plan physical exercise 3 days a week  at least 20 minutes.  Weight watchers and other programs are helpful.  Planned amount of weight loss 5 lbs.  6. Bruise, trunk, initial encounter Local care to right hip bruise.  Can use tylenol for pain.    No orders of the defined types were placed in this encounter.   Orders Placed This Encounter  Procedures  . CBC with Differential/Platelet  . Comprehensive metabolic panel  . POCT URINALYSIS DIP (CLINITEK)     Follow-up: Return if symptoms worsen or fail to improve.  An After Visit Summary was printed and given to the patient.  [facility] [phone]

## 2020-02-26 LAB — CBC WITH DIFFERENTIAL/PLATELET
Basophils Absolute: 0 10*3/uL (ref 0.0–0.2)
Basos: 1 %
EOS (ABSOLUTE): 0.1 10*3/uL (ref 0.0–0.4)
Eos: 2 %
Hematocrit: 42.2 % (ref 34.0–46.6)
Hemoglobin: 14.2 g/dL (ref 11.1–15.9)
Immature Grans (Abs): 0 10*3/uL (ref 0.0–0.1)
Immature Granulocytes: 0 %
Lymphocytes Absolute: 2 10*3/uL (ref 0.7–3.1)
Lymphs: 31 %
MCH: 31.5 pg (ref 26.6–33.0)
MCHC: 33.6 g/dL (ref 31.5–35.7)
MCV: 94 fL (ref 79–97)
Monocytes Absolute: 0.6 10*3/uL (ref 0.1–0.9)
Monocytes: 10 %
Neutrophils Absolute: 3.7 10*3/uL (ref 1.4–7.0)
Neutrophils: 56 %
Platelets: 212 10*3/uL (ref 150–450)
RBC: 4.51 x10E6/uL (ref 3.77–5.28)
RDW: 12.4 % (ref 11.7–15.4)
WBC: 6.5 10*3/uL (ref 3.4–10.8)

## 2020-02-26 LAB — COMPREHENSIVE METABOLIC PANEL
ALT: 20 IU/L (ref 0–32)
AST: 22 IU/L (ref 0–40)
Albumin/Globulin Ratio: 1.7 (ref 1.2–2.2)
Albumin: 4.5 g/dL (ref 3.8–4.8)
Alkaline Phosphatase: 95 IU/L (ref 48–121)
BUN/Creatinine Ratio: 17 (ref 12–28)
BUN: 12 mg/dL (ref 8–27)
Bilirubin Total: 0.2 mg/dL (ref 0.0–1.2)
CO2: 27 mmol/L (ref 20–29)
Calcium: 10 mg/dL (ref 8.7–10.3)
Chloride: 104 mmol/L (ref 96–106)
Creatinine, Ser: 0.72 mg/dL (ref 0.57–1.00)
GFR calc Af Amer: 103 mL/min/{1.73_m2} (ref >59)
GFR calc non Af Amer: 89 mL/min/{1.73_m2} (ref >59)
Globulin, Total: 2.6 g/dL (ref 1.5–4.5)
Glucose: 101 mg/dL — ABNORMAL HIGH (ref 65–99)
Potassium: 4.5 mmol/L (ref 3.5–5.2)
Sodium: 142 mmol/L (ref 134–144)
Total Protein: 7.1 g/dL (ref 6.0–8.5)

## 2020-02-26 NOTE — Progress Notes (Signed)
Cbc normal, glucose 101, kidney tests normal, liver tests normal lp

## 2020-03-11 ENCOUNTER — Encounter: Payer: Self-pay | Admitting: Physician Assistant

## 2020-03-11 ENCOUNTER — Other Ambulatory Visit: Payer: Self-pay

## 2020-03-11 ENCOUNTER — Ambulatory Visit (INDEPENDENT_AMBULATORY_CARE_PROVIDER_SITE_OTHER): Payer: Medicare Other | Admitting: Physician Assistant

## 2020-03-11 VITALS — BP 112/72 | HR 68 | Temp 97.0°F | Wt 194.0 lb

## 2020-03-11 DIAGNOSIS — B86 Scabies: Secondary | ICD-10-CM | POA: Diagnosis not present

## 2020-03-11 MED ORDER — PERMETHRIN 5 % EX CREA: TOPICAL_CREAM | CUTANEOUS | 0 refills | Status: DC

## 2020-03-11 NOTE — Progress Notes (Signed)
Acute Office Visit  Subjective:    Patient ID: Lisa Montoya, female    DOB: [birth year], 64 y.o.   MRN: [id number]  Chief Complaint  Patient presents with  . Rash    on back of neck and shoulders    HPI Patient is in today for rash Caregiver from home with patient - pt is mostly nonverbal and caregiver states she is here for a rash but is very curt during appointment and states she has been off work and does not know many details States patient is itching at night at the back of her neck Unsure how long rash has been present Pt does communicate that the rash is pruritic  Past Medical History:  Diagnosis Date  . Essential hypertension   . Mental disability   . Mixed hyperlipidemia   . Morbid obesity due to excess calories (HCC)   . Pneumonia due to SARS-associated coronavirus     History reviewed. No pertinent surgical history.  Family History  Family history unknown: Yes    Social History   Socioeconomic History  . Marital status: Single    Spouse name: Not on file  . Number of children: Not on file  . Years of education: Not on file  . Highest education level: Not on file  Occupational History  . Not on file  Tobacco Use  . Smoking status: Never Smoker  . Smokeless tobacco: Never Used  Substance and Sexual Activity  . Alcohol use: Never  . Drug use: Never  . Sexual activity: Not Currently  Other Topics Concern  . Not on file  Social History Narrative  . Not on file   Social Determinants of Health   Financial Resource Strain:   . Difficulty of Paying Living Expenses:   Food Insecurity:   . Worried About Programme researcher, broadcasting/film/video in the Last Year:   . Barista in the Last Year:   Transportation Needs:   . Freight forwarder (Medical):   Marland Kitchen Lack of Transportation (Non-Medical):   Physical Activity:   . Days of Exercise per Week:   . Minutes of Exercise per Session:   Stress:   . Feeling of Stress :   Social Connections:   . Frequency of  Communication with Friends and Family:   . Frequency of Social Gatherings with Friends and Family:   . Attends Religious Services:   . Active Member of Clubs or Organizations:   . Attends Banker Meetings:   Marland Kitchen Marital Status:   Intimate Partner Violence:   . Fear of Current or Ex-Partner:   . Emotionally Abused:   Marland Kitchen Physically Abused:   . Sexually Abused:      Current Outpatient Medications:  .  acetaminophen (TYLENOL) 500 MG tablet, Take 1,000 mg by mouth every 6 (six) hours as needed for mild pain., Disp: , Rfl:  .  alum & mag hydroxide-simeth (ANTACID ANTI-GAS REG STRENGTH) 200-200-20 MG/5ML suspension, Take 10 mLs by mouth every 4 (four) hours as needed for indigestion or heartburn., Disp: , Rfl:  .  aspirin EC 81 MG tablet, Take 81 mg by mouth daily., Disp: , Rfl:  .  atorvastatin (LIPITOR) 10 MG tablet, Take 10 mg by mouth daily., Disp: , Rfl:  .  bacitracin-polymyxin b (POLYSPORIN) ophthalmic ointment, Place 2 application into the left eye every 12 (twelve) hours. apply to eye every 12 hours while awake, Disp: 3.5 g, Rfl: 0 .  bisoprolol-hydrochlorothiazide (ZIAC) 10-6.25 MG tablet,  Take 0.5 tablets by mouth daily., Disp: , Rfl:  .  carbamide peroxide (DEBROX) 6.5 % OTIC solution, Place 5-10 drops into both ears 2 (two) times daily as needed (for removal of ear wax for ten days)., Disp: , Rfl:  .  chlorhexidine (PERIDEX) 0.12 % solution, SWISH & SPIT 1/2 OZ BY MOUTH FOR 30 SECONDS TWICE A DAY **NO REFILLS**, Disp: 473 mL, Rfl: 10 .  clotrimazole (LOTRIMIN) 1 % cream, Apply 1 application topically daily as needed (ringworm, athlete's foot, jock/groin itch)., Disp: , Rfl:  .  Diethyltoluamide (OFF SMOOTH & DRY EX), Apply 1 application topically as needed (for insect repellant on outings)., Disp: , Rfl:  .  diphenhydrAMINE (BENADRYL) 25 MG tablet, Take 25 mg by mouth 3 (three) times daily as needed for allergies., Disp: , Rfl:  .  hydrogen peroxide 3 % external solution,  Apply 1 application topically as needed (wound cleaning)., Disp: , Rfl:  .  lip balm (BLISTEX) OINT, Apply 1 application topically as needed for lip care., Disp: , Rfl:  .  loperamide (IMODIUM A-D) 2 MG tablet, Take 4 mg by mouth 4 (four) times daily as needed for diarrhea or loose stools., Disp: , Rfl:  .  magnesium hydroxide (MILK OF MAGNESIA) 400 MG/5ML suspension, Take 30 mLs by mouth daily as needed for mild constipation., Disp: , Rfl:  .  Multiple Vitamin (MULTIVITAMIN) tablet, Take 1 tablet by mouth daily., Disp: , Rfl:  .  neomycin-bacitracin-polymyxin (NEOSPORIN) [phone] ointment, Apply 1 application topically 3 (three) times daily as needed (wound care or skin lesions)., Disp: , Rfl:  .  Phenol-Phenolate Sodium (THROAT SPRAY MT), Use as directed 2 sprays in the mouth or throat as needed (five times daily for sore throat)., Disp: , Rfl:  .  polyvinyl alcohol (LIQUIFILM TEARS) 1.4 % ophthalmic solution, Place 1-2 drops into both eyes as needed for dry eyes., Disp: , Rfl:  .  selenium sulfide (SELSUN) 1 % LOTN, Apply 1 application topically as needed for irritation., Disp: , Rfl:  .  sodium chloride (OCEAN) 0.65 % SOLN nasal spray, Place 1-2 sprays into both nostrils as needed for congestion., Disp: , Rfl:  .  Sunscreens (SUNBLOCK SPF30 EX), Apply 1 application topically as needed (to protect from sunburn on outings)., Disp: , Rfl:  .  timolol (BETIMOL) 0.25 % ophthalmic solution, Place 1-2 drops into both eyes 2 (two) times daily., Disp: , Rfl:  .  permethrin (ELIMITE) 5 % cream, Apply from neck down - leave on 8-10 hours and then bathe after Also wash clothes and bedsheets, Disp: 60 g, Rfl: 0   No Known Allergies  CONSTITUTIONAL: negative for malaise CARDIOVASCULAR: Negative for chest pain RESPIRATORY: Negative for recent cough and dyspnea.  GASTROINTESTINAL: Negative for abdominal pain INTEGUMENTARY: see HPI          Objective:    PHYSICAL EXAM:   VS: BP 112/72 (BP Location:  Left Arm, Patient Position: Sitting)   Pulse 68   Temp (!) 97 F (36.1 C) (Temporal)   Wt 194 lb (88 kg)   SpO2 98%   BMI 39.86 kg/m   GEN: Well nourished, well developed, in no acute distress  Cardiac: RRR; no murmurs, rubs, or gallops,no edema -  Respiratory:  normal respiratory rate and pattern with no distress - normal breath sounds with no rales, rhonchi, wheezes or rubs  Skin: linear excoriated lesions noted on back of neck, shoulders, arms and wrists and few on abdomen    Wt Readings  from Last 3 Encounters:  03/11/20 194 lb (88 kg)  02/25/20 199 lb (90.3 kg)  01/16/20 197 lb (89.4 kg)    Health Maintenance Due  Topic Date Due  . Hepatitis C Screening  Never done  . COVID-19 Vaccine (1) Never done    There are no preventive care reminders to display for this patient.        Assessment & Plan:   Problem List Items Addressed This Visit      Musculoskeletal and Integument   Scabies - Primary    rx for elimite as directed Follow up if rash persists          Meds ordered this encounter  Medications  . permethrin (ELIMITE) 5 % cream    Sig: Apply from neck down - leave on 8-10 hours and then bathe after Also wash clothes and bedsheets    Dispense:  60 g    Refill:  0    Order Specific Question:   Supervising Provider    Answer:   COX, Fritzi Mandes [[id number]]     SARA R Arnette Driggs, PA-C

## 2020-03-11 NOTE — Assessment & Plan Note (Signed)
rx for elimite as directed Follow up if rash persists

## 2020-04-06 DIAGNOSIS — Z23 Encounter for immunization: Secondary | ICD-10-CM | POA: Diagnosis not present

## 2020-05-02 ENCOUNTER — Other Ambulatory Visit: Payer: Self-pay | Admitting: Family Medicine

## 2020-05-03 ENCOUNTER — Other Ambulatory Visit: Payer: Self-pay | Admitting: Family Medicine

## 2020-05-04 ENCOUNTER — Other Ambulatory Visit: Payer: Self-pay

## 2020-05-04 DIAGNOSIS — Z23 Encounter for immunization: Secondary | ICD-10-CM | POA: Diagnosis not present

## 2020-05-04 MED ORDER — ATORVASTATIN CALCIUM 10 MG PO TABS: 10.0000 mg | ORAL_TABLET | Freq: Every day | ORAL | 1 refills | Status: DC

## 2020-05-18 ENCOUNTER — Other Ambulatory Visit: Payer: Self-pay

## 2020-05-18 MED ORDER — BISOPROLOL-HYDROCHLOROTHIAZIDE 10-6.25 MG PO TABS: 0.5000 | ORAL_TABLET | Freq: Every day | ORAL | 0 refills | Status: DC

## 2020-05-25 ENCOUNTER — Encounter: Payer: Self-pay | Admitting: Family Medicine

## 2020-05-25 ENCOUNTER — Ambulatory Visit (INDEPENDENT_AMBULATORY_CARE_PROVIDER_SITE_OTHER): Payer: Medicare Other | Admitting: Family Medicine

## 2020-05-25 ENCOUNTER — Other Ambulatory Visit: Payer: Self-pay

## 2020-05-25 VITALS — BP 126/82 | HR 82 | Temp 97.5°F | Ht 59.0 in | Wt 189.0 lb

## 2020-05-25 DIAGNOSIS — I10 Essential (primary) hypertension: Secondary | ICD-10-CM

## 2020-05-25 DIAGNOSIS — E782 Mixed hyperlipidemia: Secondary | ICD-10-CM | POA: Diagnosis not present

## 2020-05-25 DIAGNOSIS — R2689 Other abnormalities of gait and mobility: Secondary | ICD-10-CM | POA: Diagnosis not present

## 2020-05-25 DIAGNOSIS — R269 Unspecified abnormalities of gait and mobility: Secondary | ICD-10-CM | POA: Diagnosis not present

## 2020-05-25 NOTE — Progress Notes (Signed)
Subjective:  Patient ID: Lisa Montoya, female    DOB: 11-11-1955  Age: 64 y.o. MRN: [id number]  Chief Complaint  Patient presents with  . Hypertension  . Hyperlipidemia    HPI  Patient is a 64 year old mentally disabled white female who presents for follow-up of her chronic medical issues.  She has poor verbal abilities.  She essentially says yes to everything.  For her hypertension she is currently on bisoprolol-hydrochlorothiazide.  For hyperlipidemia she is on Lipitor 10 mg once daily.  She stays at a group home (.  They do try to provide a healthy diet and regular exercise. Her caregiver is with her today and has no concerns at this time.  I did notice pt has had ferritin levels in the past which have been elevated, but no hemachromatosis work up completed.  Past Medical History:  Diagnosis Date  . Essential hypertension   . Mental disability   . Mixed hyperlipidemia   . Morbid obesity due to excess calories (HCC)   . Pneumonia due to SARS-associated coronavirus    History reviewed. No pertinent surgical history.  Family History  Family history unknown: Yes   Social History   Socioeconomic History  . Marital status: Single    Spouse name: Not on file  . Number of children: Not on file  . Years of education: Not on file  . Highest education level: Not on file  Occupational History  . Not on file  Tobacco Use  . Smoking status: Never Smoker  . Smokeless tobacco: Never Used  Substance and Sexual Activity  . Alcohol use: Never  . Drug use: Never  . Sexual activity: Not Currently  Other Topics Concern  . Not on file  Social History Narrative  . Not on file   Social Determinants of Health   Financial Resource Strain:   . Difficulty of Paying Living Expenses: Not on file  Food Insecurity:   . Worried About Programme researcher, broadcasting/film/video in the Last Year: Not on file  . Ran Out of Food in the Last Year: Not on file  Transportation Needs:   . Lack of Transportation  (Medical): Not on file  . Lack of Transportation (Non-Medical): Not on file  Physical Activity:   . Days of Exercise per Week: Not on file  . Minutes of Exercise per Session: Not on file  Stress:   . Feeling of Stress : Not on file  Social Connections:   . Frequency of Communication with Friends and Family: Not on file  . Frequency of Social Gatherings with Friends and Family: Not on file  . Attends Religious Services: Not on file  . Active Member of Clubs or Organizations: Not on file  . Attends Banker Meetings: Not on file  . Marital Status: Not on file    Review of Systems  Constitutional: Negative for chills, fatigue and fever.  HENT: Negative for congestion, ear pain, rhinorrhea and sore throat.   Respiratory: Negative for cough and shortness of breath.   Cardiovascular: Negative for chest pain.  Gastrointestinal: Negative for constipation and diarrhea.  Neurological:       Imbalance. No falls. Gait abnormality.     Objective:  BP 126/82   Pulse 82   Temp (!) 97.5 F (36.4 C)   Ht 4\' 11"  (1.499 m)   Wt 189 lb (85.7 kg)   SpO2 99%   BMI 38.17 kg/m   BP/Weight 05/25/2020 03/11/2020 02/25/2020  Systolic BP 126 112  120  Diastolic BP 82 72 80  Wt. (Lbs) 189 194 199  BMI 38.17 39.86 40.88    Physical Exam Vitals reviewed.  Constitutional:      Appearance: She is obese.  Cardiovascular:     Rate and Rhythm: Normal rate and regular rhythm.     Pulses: Normal pulses.     Heart sounds: Normal heart sounds.  Pulmonary:     Effort: Pulmonary effort is normal. No respiratory distress.     Breath sounds: Normal breath sounds.  Abdominal:     General: Abdomen is flat. Bowel sounds are normal.     Palpations: Abdomen is soft.     Tenderness: There is no abdominal tenderness.  Musculoskeletal:     Comments: Gait abnormality. Imbalance with walking.  Neurological:     Mental Status: She is alert. Mental status is at baseline.  Psychiatric:        Mood and  Affect: Mood normal.        Behavior: Behavior normal.     Diabetic Foot Exam - Simple   No data filed       Lab Results  Component Value Date   WBC 6.5 02/25/2020   HGB 14.2 02/25/2020   HCT 42.2 02/25/2020   PLT 212 02/25/2020   GLUCOSE 101 (H) 02/25/2020   CHOL 154 01/23/2020   TRIG 73 01/23/2020   HDL 71 01/23/2020   LDLCALC 69 01/23/2020   ALT 20 02/25/2020   AST 22 02/25/2020   NA 142 02/25/2020   K 4.5 02/25/2020   CL 104 02/25/2020   CREATININE 0.72 02/25/2020   BUN 12 02/25/2020   CO2 27 02/25/2020   TSH 1.750 01/23/2020      Assessment & Plan:   1. Essential hypertension Well controlled.  No changes to medicines.  Continue to work on eating a healthy diet and exercise.   2. Mixed hyperlipidemia Well controlled.  No changes to medicines.  Continue to work on eating a healthy diet and exercise.   3. Abnormal gait - PT Home health care referral.  4. Imbalance - PT home health care referral.  5. Morbid obesity Recommend exercise - dancing, walking or even chair exercise.  Recommend continue to work on eating healthy diet and exercise.   Follow-up: Return in about 4 months (around 09/24/2020).  An After Visit Summary was printed and given to the patient.  Blane Ohara [facility] [phone]

## 2020-05-27 ENCOUNTER — Other Ambulatory Visit: Payer: Self-pay

## 2020-05-27 ENCOUNTER — Ambulatory Visit (INDEPENDENT_AMBULATORY_CARE_PROVIDER_SITE_OTHER): Payer: Medicare Other | Admitting: Family Medicine

## 2020-05-27 ENCOUNTER — Encounter: Payer: Self-pay | Admitting: Family Medicine

## 2020-05-27 VITALS — BP 124/68 | HR 65 | Temp 96.5°F | Ht 61.0 in | Wt 188.0 lb

## 2020-05-27 DIAGNOSIS — R600 Localized edema: Secondary | ICD-10-CM

## 2020-05-27 NOTE — Progress Notes (Signed)
Acute Office Visit  Subjective:    Patient ID: Lisa Montoya, female    DOB: [birth year], 64 y.o.   MRN: [id number]  Chief Complaint  Patient presents with  . Leg Swelling    HPI Patient is in today for leg swelling, night before last patients legs were very swollen. Patient was in a rocking chair at the time and her feet were dangling vs being on the floor. Group home staff did not notice any swelling last night. Caregiver also stated that patient has a huge callus on her right foot.  Past Medical History:  Diagnosis Date  . Essential hypertension   . Mental disability   . Mixed hyperlipidemia   . Morbid obesity due to excess calories (HCC)   . Pneumonia due to SARS-associated coronavirus     History reviewed. No pertinent surgical history.  Family History  Family history unknown: Yes    Social History   Socioeconomic History  . Marital status: Single    Spouse name: Not on file  . Number of children: Not on file  . Years of education: Not on file  . Highest education level: Not on file  Occupational History  . Not on file  Tobacco Use  . Smoking status: Never Smoker  . Smokeless tobacco: Never Used  Substance and Sexual Activity  . Alcohol use: Never  . Drug use: Never  . Sexual activity: Not Currently  Other Topics Concern  . Not on file  Social History Narrative  . Not on file   Social Determinants of Health   Financial Resource Strain:   . Difficulty of Paying Living Expenses: Not on file  Food Insecurity:   . Worried About Programme researcher, broadcasting/film/video in the Last Year: Not on file  . Ran Out of Food in the Last Year: Not on file  Transportation Needs:   . Lack of Transportation (Medical): Not on file  . Lack of Transportation (Non-Medical): Not on file  Physical Activity:   . Days of Exercise per Week: Not on file  . Minutes of Exercise per Session: Not on file  Stress:   . Feeling of Stress : Not on file  Social Connections:   . Frequency of  Communication with Friends and Family: Not on file  . Frequency of Social Gatherings with Friends and Family: Not on file  . Attends Religious Services: Not on file  . Active Member of Clubs or Organizations: Not on file  . Attends Banker Meetings: Not on file  . Marital Status: Not on file  Intimate Partner Violence:   . Fear of Current or Ex-Partner: Not on file  . Emotionally Abused: Not on file  . Physically Abused: Not on file  . Sexually Abused: Not on file    Outpatient Medications Prior to Visit  Medication Sig Dispense Refill  . acetaminophen (TYLENOL) 500 MG tablet Take 1,000 mg by mouth every 6 (six) hours as needed for mild pain.    Marland Kitchen alum & mag hydroxide-simeth (ANTACID ANTI-GAS REG STRENGTH) 200-200-20 MG/5ML suspension Take 10 mLs by mouth every 4 (four) hours as needed for indigestion or heartburn.    Marland Kitchen aspirin EC 81 MG tablet Take 81 mg by mouth daily.    Marland Kitchen atorvastatin (LIPITOR) 10 MG tablet Take 1 tablet (10 mg total) by mouth daily. 90 tablet 1  . bacitracin-polymyxin b (POLYSPORIN) ophthalmic ointment Place 2 application into the left eye every 12 (twelve) hours. apply to eye every 12  hours while awake 3.5 g 0  . bisoprolol-hydrochlorothiazide (ZIAC) 10-6.25 MG tablet Take 0.5 tablets by mouth daily. 45 tablet 0  . carbamide peroxide (DEBROX) 6.5 % OTIC solution Place 5-10 drops into both ears 2 (two) times daily as needed (for removal of ear wax for ten days).    . chlorhexidine (PERIDEX) 0.12 % solution SWISH & SPIT 1/2 OZ BY MOUTH FOR 30 SECONDS TWICE A DAY **NO REFILLS** 473 mL 10  . clotrimazole (LOTRIMIN) 1 % cream Apply 1 application topically daily as needed (ringworm, athlete's foot, jock/groin itch).    . Diethyltoluamide (OFF SMOOTH & DRY EX) Apply 1 application topically as needed (for insect repellant on outings).    . diphenhydrAMINE (BENADRYL) 25 MG tablet Take 25 mg by mouth 3 (three) times daily as needed for allergies.    . hydrogen  peroxide 3 % external solution Apply 1 application topically as needed (wound cleaning).    Marland Kitchen lip balm (BLISTEX) OINT Apply 1 application topically as needed for lip care.    . loperamide (IMODIUM A-D) 2 MG tablet Take 4 mg by mouth 4 (four) times daily as needed for diarrhea or loose stools.    . magnesium hydroxide (MILK OF MAGNESIA) 400 MG/5ML suspension Take 30 mLs by mouth daily as needed for mild constipation.    . Multiple Vitamin (MULTIVITAMIN) tablet Take 1 tablet by mouth daily.    Marland Kitchen neomycin-bacitracin-polymyxin (NEOSPORIN) [phone] ointment Apply 1 application topically 3 (three) times daily as needed (wound care or skin lesions).    . permethrin (ELIMITE) 5 % cream Apply from neck down - leave on 8-10 hours and then bathe after Also wash clothes and bedsheets 60 g 0  . Phenol-Phenolate Sodium (THROAT SPRAY MT) Use as directed 2 sprays in the mouth or throat as needed (five times daily for sore throat).    . polyvinyl alcohol (LIQUIFILM TEARS) 1.4 % ophthalmic solution Place 1-2 drops into both eyes as needed for dry eyes.    Marland Kitchen selenium sulfide (SELSUN) 1 % LOTN Apply 1 application topically as needed for irritation.    . sodium chloride (OCEAN) 0.65 % SOLN nasal spray Place 1-2 sprays into both nostrils as needed for congestion.    . Sunscreens (SUNBLOCK SPF30 EX) Apply 1 application topically as needed (to protect from sunburn on outings).    . timolol (BETIMOL) 0.25 % ophthalmic solution Place 1-2 drops into both eyes 2 (two) times daily.     No facility-administered medications prior to visit.    No Known Allergies  Review of Systems  Constitutional: Negative for chills, fatigue and fever.  HENT: Negative for congestion, ear pain, rhinorrhea, sinus pain and sore throat.   Respiratory: Negative for cough and shortness of breath.   Cardiovascular: Positive for leg swelling. Negative for chest pain.  Gastrointestinal: Negative for diarrhea and nausea.       Objective:      Physical Exam Vitals reviewed.  Constitutional:      Appearance: Normal appearance. She is obese.  Cardiovascular:     Rate and Rhythm: Normal rate and regular rhythm.     Pulses: Normal pulses.     Heart sounds: Normal heart sounds.  Pulmonary:     Effort: Pulmonary effort is normal. No respiratory distress.     Breath sounds: Normal breath sounds.  Musculoskeletal:     Right lower leg: Edema (trace) present.     Left lower leg: Edema (trace) present.  Neurological:     Mental  Status: She is alert and oriented to person, place, and time.  Psychiatric:        Mood and Affect: Mood normal.        Behavior: Behavior normal.     BP 124/68   Pulse 65   Temp (!) 96.5 F (35.8 C)   Ht 5\' 1"  (1.549 m)   Wt 188 lb (85.3 kg)   SpO2 96%   BMI 35.52 kg/m  Wt Readings from Last 3 Encounters:  05/27/20 188 lb (85.3 kg)  05/25/20 189 lb (85.7 kg)  03/11/20 194 lb (88 kg)    Health Maintenance Due  Topic Date Due  . Hepatitis C Screening  Never done  . COVID-19 Vaccine (1) Never done  . INFLUENZA VACCINE  04/05/2020    There are no preventive care reminders to display for this patient.   Lab Results  Component Value Date   TSH 1.750 01/23/2020   Lab Results  Component Value Date   WBC 6.5 02/25/2020   HGB 14.2 02/25/2020   HCT 42.2 02/25/2020   MCV 94 02/25/2020   PLT 212 02/25/2020   Lab Results  Component Value Date   NA 142 02/25/2020   K 4.5 02/25/2020   CO2 27 02/25/2020   GLUCOSE 101 (H) 02/25/2020   BUN 12 02/25/2020   CREATININE 0.72 02/25/2020   BILITOT 0.2 02/25/2020   ALKPHOS 95 02/25/2020   AST 22 02/25/2020   ALT 20 02/25/2020   PROT 7.1 02/25/2020   ALBUMIN 4.5 02/25/2020   CALCIUM 10.0 02/25/2020   ANIONGAP 10 06/16/2019   Lab Results  Component Value Date   CHOL 154 01/23/2020   Lab Results  Component Value Date   HDL 71 01/23/2020   Lab Results  Component Value Date   LDLCALC 69 01/23/2020   Lab Results  Component Value Date    TRIG 73 01/23/2020   Lab Results  Component Value Date   CHOLHDL 2.2 01/23/2020   No results found for: HGBA1C     Assessment & Plan:  1. Pedal edema Recommend low salt diet, elevate feet.  Consider compression socks.  Follow-up: No follow-ups on file.  An After Visit Summary was printed and given to the patient.  01/25/2020 [facility] [phone]

## 2020-05-27 NOTE — Patient Instructions (Addendum)
LOW SALT DIET.  ELEVATE LEGS.  COMPRESSION SOCKS.

## 2020-06-02 ENCOUNTER — Ambulatory Visit: Payer: Medicare Other | Admitting: Sports Medicine

## 2020-06-13 ENCOUNTER — Encounter: Payer: Self-pay | Admitting: Family Medicine

## 2020-06-16 ENCOUNTER — Encounter: Payer: Self-pay | Admitting: Sports Medicine

## 2020-06-16 ENCOUNTER — Ambulatory Visit (INDEPENDENT_AMBULATORY_CARE_PROVIDER_SITE_OTHER): Payer: Medicare Other | Admitting: Sports Medicine

## 2020-06-16 ENCOUNTER — Other Ambulatory Visit: Payer: Self-pay

## 2020-06-16 DIAGNOSIS — M79609 Pain in unspecified limb: Secondary | ICD-10-CM

## 2020-06-16 DIAGNOSIS — L84 Corns and callosities: Secondary | ICD-10-CM

## 2020-06-16 DIAGNOSIS — B351 Tinea unguium: Secondary | ICD-10-CM

## 2020-06-16 DIAGNOSIS — F79 Unspecified intellectual disabilities: Secondary | ICD-10-CM | POA: Diagnosis not present

## 2020-06-16 DIAGNOSIS — I739 Peripheral vascular disease, unspecified: Secondary | ICD-10-CM | POA: Diagnosis not present

## 2020-06-16 NOTE — Progress Notes (Signed)
Subjective: YTZEL GUBLER is a 64 y.o. female patient seen today in office with complaint of mildly painful thickened and elongated toenails and callus; unable to trim. Patient is assisted by facility aide who denies any history that she knows of diabetes or neuropathy but does admit to one episode of the feet swelling and her PCP put her on fluid medication that has helped reports that she lives at [facility] and has been a patient there for the last 15 to 20 years and also reports that she has had left ankle surgery in the past with a plate/hardware put at the lateral ankle.  Denies any other pedal complaints at this time.  No other issues noted.  Patient Active Problem List   Diagnosis Date Noted  . Scabies 03/11/2020  . Falls frequently 02/25/2020  . BMI 40.0-44.9, adult (HCC) 02/25/2020  . Morbid obesity (HCC) 02/25/2020  . Bruise, trunk, initial encounter 02/25/2020  . Mixed hyperlipidemia 01/16/2020  . Elevated ferritin 01/16/2020  . Bilateral impacted cerumen 01/16/2020  . Intellectual disability 06/11/2019  . HTN (hypertension) 06/11/2019    Current Outpatient Medications on File Prior to Visit  Medication Sig Dispense Refill  . acetaminophen (TYLENOL) 500 MG tablet Take 1,000 mg by mouth every 6 (six) hours as needed for mild pain.    Marland Kitchen alum & mag hydroxide-simeth (ANTACID ANTI-GAS REG STRENGTH) 200-200-20 MG/5ML suspension Take 10 mLs by mouth every 4 (four) hours as needed for indigestion or heartburn.    Marland Kitchen aspirin EC 81 MG tablet Take 81 mg by mouth daily.    Marland Kitchen atorvastatin (LIPITOR) 10 MG tablet Take 1 tablet (10 mg total) by mouth daily. 90 tablet 1  . bacitracin-polymyxin b (POLYSPORIN) ophthalmic ointment Place 2 application into the left eye every 12 (twelve) hours. apply to eye every 12 hours while awake 3.5 g 0  . bisoprolol-hydrochlorothiazide (ZIAC) 10-6.25 MG tablet Take 0.5 tablets by mouth daily. 45 tablet 0  . carbamide peroxide (DEBROX) 6.5 % OTIC solution Place  5-10 drops into both ears 2 (two) times daily as needed (for removal of ear wax for ten days).    . chlorhexidine (PERIDEX) 0.12 % solution SWISH & SPIT 1/2 OZ BY MOUTH FOR 30 SECONDS TWICE A DAY **NO REFILLS** 473 mL 10  . clotrimazole (LOTRIMIN) 1 % cream Apply 1 application topically daily as needed (ringworm, athlete's foot, jock/groin itch).    . Diethyltoluamide (OFF SMOOTH & DRY EX) Apply 1 application topically as needed (for insect repellant on outings).    . diphenhydrAMINE (BENADRYL) 25 MG tablet Take 25 mg by mouth 3 (three) times daily as needed for allergies.    . hydrogen peroxide 3 % external solution Apply 1 application topically as needed (wound cleaning).    Marland Kitchen lip balm (BLISTEX) OINT Apply 1 application topically as needed for lip care.    . loperamide (IMODIUM A-D) 2 MG tablet Take 4 mg by mouth 4 (four) times daily as needed for diarrhea or loose stools.    . magnesium hydroxide (MILK OF MAGNESIA) 400 MG/5ML suspension Take 30 mLs by mouth daily as needed for mild constipation.    . Multiple Vitamin (MULTIVITAMIN) tablet Take 1 tablet by mouth daily.    Marland Kitchen neomycin-bacitracin-polymyxin (NEOSPORIN) [phone] ointment Apply 1 application topically 3 (three) times daily as needed (wound care or skin lesions).    . permethrin (ELIMITE) 5 % cream Apply from neck down - leave on 8-10 hours and then bathe after Also wash clothes and bedsheets  60 g 0  . Phenol-Phenolate Sodium (THROAT SPRAY MT) Use as directed 2 sprays in the mouth or throat as needed (five times daily for sore throat).    . polyvinyl alcohol (LIQUIFILM TEARS) 1.4 % ophthalmic solution Place 1-2 drops into both eyes as needed for dry eyes.    Marland Kitchen selenium sulfide (SELSUN) 1 % LOTN Apply 1 application topically as needed for irritation.    . sodium chloride (OCEAN) 0.65 % SOLN nasal spray Place 1-2 sprays into both nostrils as needed for congestion.    . Sunscreens (SUNBLOCK SPF30 EX) Apply 1 application topically as needed  (to protect from sunburn on outings).    . timolol (BETIMOL) 0.25 % ophthalmic solution Place 1-2 drops into both eyes 2 (two) times daily.     No current facility-administered medications on file prior to visit.    No Known Allergies  Objective: Physical Exam  General: Well developed, nourished, no acute distress, mental status is difficult to discern due to patient's intellectual disability  Vascular: Dorsalis pedis artery 1/4 bilateral, Posterior tibial artery 1/4 bilateral, skin temperature warm to warm proximal to distal bilateral lower extremities, minimal varicosities, minimal pedal hair present bilateral.  Neurological: Gross sensation present via light touch bilateral.   Dermatological: Skin is warm, dry, and supple bilateral, Nails 1-10 are tender, long, thick, and discolored with mild subungal debris, no webspace macerations present bilateral, callus/hyperkeratotic tissue present submet 1 on right.  No signs of infection bilateral.  Musculoskeletal:Asymptomatic fat pad atrophy and prominent first metatarsal head boney deformities noted bilateral. Muscular strength within normal limits without painon range of motion. No pain with calf compression bilateral.  Assessment and Plan:  Problem List Items Addressed This Visit      Other   Intellectual disability    Other Visit Diagnoses    Pain due to onychomycosis of nail    -  Primary   Callus       PVD (peripheral vascular disease) (HCC)           -Examined patient.  -Discussed treatment options for painful mycotic nails and callus. -Mechanically debrided and reduced mycotic nails with sterile nail nipper and dremel nail file without incident. -Chemically debrided callus using a sterile chisel blade without incident symptomatic 1 on right -Recommend good supportive shoes daily for foot type -Continue with elevation of legs to assist with any edema control and continue with PCP management of edema with Lasix -Patient to  return in 3 months for follow up evaluation or sooner if symptoms worsen.  Asencion Islam, DPM

## 2020-06-24 ENCOUNTER — Ambulatory Visit: Payer: Medicare Other

## 2020-06-24 ENCOUNTER — Other Ambulatory Visit: Payer: Self-pay

## 2020-06-24 DIAGNOSIS — Z23 Encounter for immunization: Secondary | ICD-10-CM

## 2020-08-12 ENCOUNTER — Encounter: Payer: Self-pay | Admitting: Family Medicine

## 2020-08-12 ENCOUNTER — Ambulatory Visit (INDEPENDENT_AMBULATORY_CARE_PROVIDER_SITE_OTHER): Payer: Medicare Other | Admitting: Family Medicine

## 2020-08-12 ENCOUNTER — Other Ambulatory Visit: Payer: Self-pay

## 2020-08-12 VITALS — BP 124/84 | HR 78 | Temp 98.3°F | Resp 16 | Wt 197.0 lb

## 2020-08-12 DIAGNOSIS — R310 Gross hematuria: Secondary | ICD-10-CM | POA: Diagnosis not present

## 2020-08-12 DIAGNOSIS — L282 Other prurigo: Secondary | ICD-10-CM

## 2020-08-12 DIAGNOSIS — R9389 Abnormal findings on diagnostic imaging of other specified body structures: Secondary | ICD-10-CM

## 2020-08-12 DIAGNOSIS — N939 Abnormal uterine and vaginal bleeding, unspecified: Secondary | ICD-10-CM | POA: Diagnosis not present

## 2020-08-12 LAB — POCT URINALYSIS DIP (CLINITEK)
Bilirubin, UA: NEGATIVE
Glucose, UA: NEGATIVE mg/dL
Ketones, POC UA: NEGATIVE mg/dL
Leukocytes, UA: NEGATIVE
Nitrite, UA: NEGATIVE
Spec Grav, UA: 1.015 (ref 1.010–1.025)
pH, UA: 7 (ref 5.0–8.0)

## 2020-08-12 NOTE — Addendum Note (Signed)
Addended by: Arman Bogus A on: 08/12/2020 02:49 PM   Modules accepted: Orders

## 2020-08-12 NOTE — Progress Notes (Addendum)
Acute Office Visit  Subjective:    Patient ID: Lisa Montoya, female    DOB: [birth year], 64 y.o.   MRN: [id number]  Chief Complaint  Patient presents with  . Rash    HPI Patient is in today for bleeding in shower and in her depends.  Patient has been postmenopausal for many years.  It is not clear whether this is vaginal versus blood in her urine.  Patient denies any pain.  Patient is minimally verbal so it is difficult to assess.  It was not associated with a bowel movement.  Past Medical History:  Diagnosis Date  . Essential hypertension   . Mental disability   . Mixed hyperlipidemia   . Morbid obesity due to excess calories (HCC)   . Pneumonia due to SARS-associated coronavirus     No past surgical history on file.  Family History  Family history unknown: Yes    Social History   Socioeconomic History  . Marital status: Single    Spouse name: Not on file  . Number of children: Not on file  . Years of education: Not on file  . Highest education level: Not on file  Occupational History  . Not on file  Tobacco Use  . Smoking status: Never Smoker  . Smokeless tobacco: Never Used  Substance and Sexual Activity  . Alcohol use: Never  . Drug use: Never  . Sexual activity: Not Currently  Other Topics Concern  . Not on file  Social History Narrative  . Not on file   Social Determinants of Health   Financial Resource Strain:   . Difficulty of Paying Living Expenses: Not on file  Food Insecurity:   . Worried About Programme researcher, broadcasting/film/video in the Last Year: Not on file  . Ran Out of Food in the Last Year: Not on file  Transportation Needs:   . Lack of Transportation (Medical): Not on file  . Lack of Transportation (Non-Medical): Not on file  Physical Activity:   . Days of Exercise per Week: Not on file  . Minutes of Exercise per Session: Not on file  Stress:   . Feeling of Stress : Not on file  Social Connections:   . Frequency of Communication with Friends  and Family: Not on file  . Frequency of Social Gatherings with Friends and Family: Not on file  . Attends Religious Services: Not on file  . Active Member of Clubs or Organizations: Not on file  . Attends Banker Meetings: Not on file  . Marital Status: Not on file  Intimate Partner Violence:   . Fear of Current or Ex-Partner: Not on file  . Emotionally Abused: Not on file  . Physically Abused: Not on file  . Sexually Abused: Not on file    Outpatient Medications Prior to Visit  Medication Sig Dispense Refill  . acetaminophen (TYLENOL) 500 MG tablet Take 1,000 mg by mouth every 6 (six) hours as needed for mild pain.    Marland Kitchen alum & mag hydroxide-simeth (ANTACID ANTI-GAS REG STRENGTH) 200-200-20 MG/5ML suspension Take 10 mLs by mouth every 4 (four) hours as needed for indigestion or heartburn.    Marland Kitchen aspirin EC 81 MG tablet Take 81 mg by mouth daily.    Marland Kitchen atorvastatin (LIPITOR) 10 MG tablet Take 1 tablet (10 mg total) by mouth daily. 90 tablet 1  . bacitracin-polymyxin b (POLYSPORIN) ophthalmic ointment Place 2 application into the left eye every 12 (twelve) hours. apply to eye every  12 hours while awake 3.5 g 0  . bisoprolol-hydrochlorothiazide (ZIAC) 10-6.25 MG tablet Take 0.5 tablets by mouth daily. 45 tablet 0  . carbamide peroxide (DEBROX) 6.5 % OTIC solution Place 5-10 drops into both ears 2 (two) times daily as needed (for removal of ear wax for ten days).    . chlorhexidine (PERIDEX) 0.12 % solution SWISH & SPIT 1/2 OZ BY MOUTH FOR 30 SECONDS TWICE A DAY **NO REFILLS** 473 mL 10  . clotrimazole (LOTRIMIN) 1 % cream Apply 1 application topically daily as needed (ringworm, athlete's foot, jock/groin itch).    . Diethyltoluamide (OFF SMOOTH & DRY EX) Apply 1 application topically as needed (for insect repellant on outings).    . diphenhydrAMINE (BENADRYL) 25 MG tablet Take 25 mg by mouth 3 (three) times daily as needed for allergies.    . hydrogen peroxide 3 % external solution  Apply 1 application topically as needed (wound cleaning).    Marland Kitchen lip balm (BLISTEX) OINT Apply 1 application topically as needed for lip care.    . loperamide (IMODIUM A-D) 2 MG tablet Take 4 mg by mouth 4 (four) times daily as needed for diarrhea or loose stools.    . magnesium hydroxide (MILK OF MAGNESIA) 400 MG/5ML suspension Take 30 mLs by mouth daily as needed for mild constipation.    . Multiple Vitamin (MULTIVITAMIN) tablet Take 1 tablet by mouth daily.    Marland Kitchen neomycin-bacitracin-polymyxin (NEOSPORIN) [phone] ointment Apply 1 application topically 3 (three) times daily as needed (wound care or skin lesions).    . permethrin (ELIMITE) 5 % cream Apply from neck down - leave on 8-10 hours and then bathe after Also wash clothes and bedsheets 60 g 0  . Phenol-Phenolate Sodium (THROAT SPRAY MT) Use as directed 2 sprays in the mouth or throat as needed (five times daily for sore throat).    . polyvinyl alcohol (LIQUIFILM TEARS) 1.4 % ophthalmic solution Place 1-2 drops into both eyes as needed for dry eyes.    Marland Kitchen selenium sulfide (SELSUN) 1 % LOTN Apply 1 application topically as needed for irritation.    . sodium chloride (OCEAN) 0.65 % SOLN nasal spray Place 1-2 sprays into both nostrils as needed for congestion.    . Sunscreens (SUNBLOCK SPF30 EX) Apply 1 application topically as needed (to protect from sunburn on outings).    . timolol (BETIMOL) 0.25 % ophthalmic solution Place 1-2 drops into both eyes 2 (two) times daily.     No facility-administered medications prior to visit.    No Known Allergies  Review of Systems  Constitutional: Negative for chills, fatigue and fever.  HENT: Negative for congestion, rhinorrhea and sore throat.   Respiratory: Negative for cough and shortness of breath.   Cardiovascular: Negative for chest pain.  Gastrointestinal: Negative for abdominal pain, constipation, diarrhea, nausea and vomiting.  Genitourinary: Positive for hematuria and vaginal bleeding.  Negative for dysuria and urgency.  Musculoskeletal: Negative for back pain and myalgias.  Skin: Positive for rash.  Neurological: Negative for dizziness, weakness, light-headedness and headaches.  Psychiatric/Behavioral: Negative for dysphoric mood. The patient is not nervous/anxious.        Objective:    Physical Exam Vitals reviewed.  Constitutional:      Appearance: Normal appearance. She is obese.  Cardiovascular:     Rate and Rhythm: Normal rate and regular rhythm.     Heart sounds: Normal heart sounds.  Pulmonary:     Effort: Pulmonary effort is normal.     Breath  sounds: Normal breath sounds.  Abdominal:     General: Abdomen is flat.     Palpations: Abdomen is soft. There is no mass.     Tenderness: There is no abdominal tenderness. There is no right CVA tenderness or left CVA tenderness.  Genitourinary:    General: Normal vulva.     Vagina: No vaginal discharge.     Comments: No blood in vaginal vault.   Skin:    Comments: Excoriations on lower abdomen from scratching. No rash apparent.   Neurological:     Mental Status: She is alert.     BP 124/84   Pulse 78   Temp 98.3 F (36.8 C)   Resp 16   Wt 197 lb (89.4 kg)   BMI 37.22 kg/m  Wt Readings from Last 3 Encounters:  08/12/20 197 lb (89.4 kg)  05/27/20 188 lb (85.3 kg)  05/25/20 189 lb (85.7 kg)    Health Maintenance Due  Topic Date Due  . Hepatitis C Screening  Never done  . COVID-19 Vaccine (1) Never done    There are no preventive care reminders to display for this patient.   Lab Results  Component Value Date   TSH 1.750 01/23/2020   Lab Results  Component Value Date   WBC 6.5 02/25/2020   HGB 14.2 02/25/2020   HCT 42.2 02/25/2020   MCV 94 02/25/2020   PLT 212 02/25/2020   Lab Results  Component Value Date   NA 142 02/25/2020   K 4.5 02/25/2020   CO2 27 02/25/2020   GLUCOSE 101 (H) 02/25/2020   BUN 12 02/25/2020   CREATININE 0.72 02/25/2020   BILITOT 0.2 02/25/2020   ALKPHOS  95 02/25/2020   AST 22 02/25/2020   ALT 20 02/25/2020   PROT 7.1 02/25/2020   ALBUMIN 4.5 02/25/2020   CALCIUM 10.0 02/25/2020   ANIONGAP 10 06/16/2019   Lab Results  Component Value Date   CHOL 154 01/23/2020   Lab Results  Component Value Date   HDL 71 01/23/2020   Lab Results  Component Value Date   LDLCALC 69 01/23/2020   Lab Results  Component Value Date   TRIG 73 01/23/2020   Lab Results  Component Value Date   CHOLHDL 2.2 01/23/2020   No results found for: HGBA1C     Assessment & Plan:  1. Gross hematuria - POCT URINALYSIS DIP (CLINITEK) - US Renal - pt straining urine at home to look for stones.   2. Vaginal bleeding - US Pelvis Complete  3. Pruritic rash No rash evident. Monitor.   No orders of the defined types were placed in this encounter.   Orders Placed This Encounter  Procedures  . US Renal  . US Pelvis Complete  . POCT URINALYSIS DIP (CLINITEK)     I spent 30 minutes dedicated to the care of this patient on the date of this encounter to include face-to-face time with the patient, as well as: speaking with her Lisa Montoya [phone].  Follow-up: No follow-ups on file.  An After Visit Summary was printed and given to the patient.  Blane Ohara, MD [facility] [phone]  ADDENDUM: [place] of pelvis and kidneys No stones.  Uterine lining very thickened.  Refer to Gynecology for possible EMB and discussion of treatment of potential uterine cancer. Pt is mentally disabled so it is difficult to assess how aggressive to be with treatment.  Her HCPOA is Lisa Montoya [phone].

## 2020-08-13 ENCOUNTER — Encounter: Payer: Self-pay | Admitting: Family Medicine

## 2020-08-13 DIAGNOSIS — R9389 Abnormal findings on diagnostic imaging of other specified body structures: Secondary | ICD-10-CM | POA: Diagnosis not present

## 2020-08-13 DIAGNOSIS — R310 Gross hematuria: Secondary | ICD-10-CM | POA: Diagnosis not present

## 2020-08-13 DIAGNOSIS — R319 Hematuria, unspecified: Secondary | ICD-10-CM | POA: Diagnosis not present

## 2020-08-13 DIAGNOSIS — N939 Abnormal uterine and vaginal bleeding, unspecified: Secondary | ICD-10-CM | POA: Diagnosis not present

## 2020-08-16 ENCOUNTER — Encounter: Payer: Self-pay | Admitting: Family Medicine

## 2020-08-16 NOTE — Progress Notes (Unsigned)
a 

## 2020-08-17 LAB — URINE CULTURE

## 2020-08-17 NOTE — Addendum Note (Signed)
Addended byBlane Ohara on: 08/17/2020 10:26 AM   Modules accepted: Orders

## 2020-09-15 ENCOUNTER — Ambulatory Visit (INDEPENDENT_AMBULATORY_CARE_PROVIDER_SITE_OTHER): Payer: Medicare Other

## 2020-09-15 DIAGNOSIS — Z23 Encounter for immunization: Secondary | ICD-10-CM

## 2020-09-15 NOTE — Progress Notes (Signed)
   Covid-19 Vaccination Clinic  Name:  Lisa Montoya    MRN: [id number] DOB: Dec 11, 1955  09/15/2020  [name] was observed post Covid-19 immunization for 15 minutes without incident. She was provided with Vaccine Information Sheet and instruction to access the V-Safe system.   [name] was instructed to call 911 with any severe reactions post vaccine: Marland Kitchen Difficulty breathing  . Swelling of face and throat  . A fast heartbeat  . A bad rash all over body  . Dizziness and weakness   Immunizations Administered    Name Date Dose VIS Date Route   Moderna Covid-19 Booster Vaccine 09/15/2020  3:45 PM 0.25 mL 06/24/2020 Intramuscular   Manufacturer: Moderna   Lot: [id number]   NDC: 56979-480-16

## 2020-09-16 ENCOUNTER — Ambulatory Visit: Payer: Medicare Other | Admitting: Sports Medicine

## 2020-09-23 ENCOUNTER — Ambulatory Visit: Payer: Medicare Other | Admitting: Family Medicine

## 2020-10-09 ENCOUNTER — Encounter: Payer: Self-pay | Admitting: Sports Medicine

## 2020-10-09 ENCOUNTER — Ambulatory Visit (INDEPENDENT_AMBULATORY_CARE_PROVIDER_SITE_OTHER): Payer: Medicare Other | Admitting: Sports Medicine

## 2020-10-09 DIAGNOSIS — B351 Tinea unguium: Secondary | ICD-10-CM | POA: Diagnosis not present

## 2020-10-09 DIAGNOSIS — F79 Unspecified intellectual disabilities: Secondary | ICD-10-CM

## 2020-10-09 DIAGNOSIS — L84 Corns and callosities: Secondary | ICD-10-CM

## 2020-10-09 DIAGNOSIS — M79609 Pain in unspecified limb: Secondary | ICD-10-CM | POA: Diagnosis not present

## 2020-10-09 DIAGNOSIS — I739 Peripheral vascular disease, unspecified: Secondary | ICD-10-CM

## 2020-10-09 NOTE — Progress Notes (Signed)
Subjective: Lisa Montoya is a 65 y.o. female patient seen today in office with complaint of mildly painful thickened and elongated toenails and callus; unable to trim. Patient is assisted by facility aide who denies any changes with medical history since last encounter.   Patient Active Problem List   Diagnosis Date Noted  . Scabies 03/11/2020  . Falls frequently 02/25/2020  . BMI 40.0-44.9, adult (HCC) 02/25/2020  . Morbid obesity (HCC) 02/25/2020  . Bruise, trunk, initial encounter 02/25/2020  . Mixed hyperlipidemia 01/16/2020  . Elevated ferritin 01/16/2020  . Bilateral impacted cerumen 01/16/2020  . Intellectual disability 06/11/2019  . HTN (hypertension) 06/11/2019    Current Outpatient Medications on File Prior to Visit  Medication Sig Dispense Refill  . acetaminophen (TYLENOL) 500 MG tablet Take 1,000 mg by mouth every 6 (six) hours as needed for mild pain.    Marland Kitchen alum & mag hydroxide-simeth (ANTACID ANTI-GAS REG STRENGTH) 200-200-20 MG/5ML suspension Take 10 mLs by mouth every 4 (four) hours as needed for indigestion or heartburn.    Marland Kitchen aspirin EC 81 MG tablet Take 81 mg by mouth daily.    Marland Kitchen atorvastatin (LIPITOR) 10 MG tablet Take 1 tablet (10 mg total) by mouth daily. 90 tablet 1  . bacitracin-polymyxin b (POLYSPORIN) ophthalmic ointment Place 2 application into the left eye every 12 (twelve) hours. apply to eye every 12 hours while awake 3.5 g 0  . bisoprolol-hydrochlorothiazide (ZIAC) 10-6.25 MG tablet Take 0.5 tablets by mouth daily. 45 tablet 0  . carbamide peroxide (DEBROX) 6.5 % OTIC solution Place 5-10 drops into both ears 2 (two) times daily as needed (for removal of ear wax for ten days).    . chlorhexidine (PERIDEX) 0.12 % solution SWISH & SPIT 1/2 OZ BY MOUTH FOR 30 SECONDS TWICE A DAY **NO REFILLS** 473 mL 10  . clotrimazole (LOTRIMIN) 1 % cream Apply 1 application topically daily as needed (ringworm, athlete's foot, jock/groin itch).    . Diethyltoluamide (OFF  SMOOTH & DRY EX) Apply 1 application topically as needed (for insect repellant on outings).    . diphenhydrAMINE (BENADRYL) 25 MG tablet Take 25 mg by mouth 3 (three) times daily as needed for allergies.    . hydrogen peroxide 3 % external solution Apply 1 application topically as needed (wound cleaning).    Marland Kitchen lip balm (BLISTEX) OINT Apply 1 application topically as needed for lip care.    . loperamide (IMODIUM A-D) 2 MG tablet Take 4 mg by mouth 4 (four) times daily as needed for diarrhea or loose stools.    . magnesium hydroxide (MILK OF MAGNESIA) 400 MG/5ML suspension Take 30 mLs by mouth daily as needed for mild constipation.    . Multiple Vitamin (MULTIVITAMIN) tablet Take 1 tablet by mouth daily.    Marland Kitchen neomycin-bacitracin-polymyxin (NEOSPORIN) [phone] ointment Apply 1 application topically 3 (three) times daily as needed (wound care or skin lesions).    . permethrin (ELIMITE) 5 % cream Apply from neck down - leave on 8-10 hours and then bathe after Also wash clothes and bedsheets 60 g 0  . Phenol-Phenolate Sodium (THROAT SPRAY MT) Use as directed 2 sprays in the mouth or throat as needed (five times daily for sore throat).    . polyvinyl alcohol (LIQUIFILM TEARS) 1.4 % ophthalmic solution Place 1-2 drops into both eyes as needed for dry eyes.    Marland Kitchen selenium sulfide (SELSUN) 1 % LOTN Apply 1 application topically as needed for irritation.    . sodium chloride (  OCEAN) 0.65 % SOLN nasal spray Place 1-2 sprays into both nostrils as needed for congestion.    . Sunscreens (SUNBLOCK SPF30 EX) Apply 1 application topically as needed (to protect from sunburn on outings).    . timolol (BETIMOL) 0.25 % ophthalmic solution Place 1-2 drops into both eyes 2 (two) times daily.    . timolol (TIMOPTIC) 0.25 % ophthalmic solution SMARTSIG:In Eye(s)     No current facility-administered medications on file prior to visit.    No Known Allergies  Objective: Physical Exam  General: Well developed,  nourished, no acute distress, mental status is difficult to discern due to patient's intellectual disability  Vascular: Dorsalis pedis artery 1/4 bilateral, Posterior tibial artery 1/4 bilateral, skin temperature warm to warm proximal to distal bilateral lower extremities, minimal varicosities, minimal pedal hair present bilateral.  Neurological: Gross sensation present via light touch bilateral.   Dermatological: Skin is warm, dry, and supple bilateral, Nails 1-10 are tender, long, thick, and discolored with mild subungal debris, no webspace macerations present bilateral, callus/hyperkeratotic tissue present submet 1 on right>left.  No signs of infection bilateral.  Musculoskeletal:Asymptomatic fat pad atrophy and prominent first metatarsal head boney deformities noted bilateral. Muscular strength within normal limits without painon range of motion. No pain with calf compression bilateral.  Assessment and Plan:  Problem List Items Addressed This Visit      Other   Intellectual disability    Other Visit Diagnoses    Pain due to onychomycosis of nail    -  Primary   Callus       PVD (peripheral vascular disease) (HCC)           -Examined patient.  -Re-Discussed treatment options for painful mycotic nails and callus. -Mechanically debrided and reduced mycotic nails with sterile nail nipper and dremel nail file without incident. -Smoothed callus using a rotary bur without incident bilateral -Recommend good supportive shoes daily for foot type -Gave sample of foot miracle cream to use for dry skin -Continue with elevation to assist with edema control -Patient to return in 3 months for follow up evaluation or sooner if symptoms worsen.  Asencion Islam, DPM

## 2020-10-14 ENCOUNTER — Ambulatory Visit: Payer: Medicare Other | Admitting: Family Medicine

## 2020-10-15 NOTE — Progress Notes (Signed)
Subjective:  Patient ID: Lisa Montoya, female    DOB: 03-26-1956  Age: 65 y.o. MRN: [id number]  Chief Complaint  Patient presents with  . Hypertension  . Hyperlipidemia    HPI Cholesterol-Lipitor 10 mg once dialy.  HTN-Bisoprolol-HCTZ 10/6.25 mg daily.  Endometrial thickening with postmenopausal bleeding. Pt was supposed to see gynecology at the end of 08/2021. Abnormal pelvic US.    Current Outpatient Medications on File Prior to Visit  Medication Sig Dispense Refill  . acetaminophen (TYLENOL) 500 MG tablet Take 1,000 mg by mouth every 6 (six) hours as needed for mild pain.    Marland Kitchen alum & mag hydroxide-simeth (ANTACID ANTI-GAS REG STRENGTH) 200-200-20 MG/5ML suspension Take 10 mLs by mouth every 4 (four) hours as needed for indigestion or heartburn.    Marland Kitchen aspirin EC 81 MG tablet Take 81 mg by mouth daily.    Marland Kitchen atorvastatin (LIPITOR) 10 MG tablet Take 1 tablet (10 mg total) by mouth daily. 90 tablet 1  . bacitracin-polymyxin b (POLYSPORIN) ophthalmic ointment Place 2 application into the left eye every 12 (twelve) hours. apply to eye every 12 hours while awake 3.5 g 0  . bisoprolol-hydrochlorothiazide (ZIAC) 10-6.25 MG tablet Take 0.5 tablets by mouth daily. 45 tablet 0  . carbamide peroxide (DEBROX) 6.5 % OTIC solution Place 5-10 drops into both ears 2 (two) times daily as needed (for removal of ear wax for ten days).    . chlorhexidine (PERIDEX) 0.12 % solution SWISH & SPIT 1/2 OZ BY MOUTH FOR 30 SECONDS TWICE A DAY **NO REFILLS** 473 mL 10  . clotrimazole (LOTRIMIN) 1 % cream Apply 1 application topically daily as needed (ringworm, athlete's foot, jock/groin itch).    . Diethyltoluamide (OFF SMOOTH & DRY EX) Apply 1 application topically as needed (for insect repellant on outings).    . diphenhydrAMINE (BENADRYL) 25 MG tablet Take 25 mg by mouth 3 (three) times daily as needed for allergies.    . hydrogen peroxide 3 % external solution Apply 1 application topically as needed (wound  cleaning).    Marland Kitchen lip balm (BLISTEX) OINT Apply 1 application topically as needed for lip care.    . loperamide (IMODIUM A-D) 2 MG tablet Take 4 mg by mouth 4 (four) times daily as needed for diarrhea or loose stools.    . magnesium hydroxide (MILK OF MAGNESIA) 400 MG/5ML suspension Take 30 mLs by mouth daily as needed for mild constipation.    . Multiple Vitamin (MULTIVITAMIN) tablet Take 1 tablet by mouth daily.    Marland Kitchen neomycin-bacitracin-polymyxin (NEOSPORIN) [phone] ointment Apply 1 application topically 3 (three) times daily as needed (wound care or skin lesions).    . permethrin (ELIMITE) 5 % cream Apply from neck down - leave on 8-10 hours and then bathe after Also wash clothes and bedsheets 60 g 0  . Phenol-Phenolate Sodium (THROAT SPRAY MT) Use as directed 2 sprays in the mouth or throat as needed (five times daily for sore throat).    . polyvinyl alcohol (LIQUIFILM TEARS) 1.4 % ophthalmic solution Place 1-2 drops into both eyes as needed for dry eyes.    Marland Kitchen selenium sulfide (SELSUN) 1 % LOTN Apply 1 application topically as needed for irritation.    . sodium chloride (OCEAN) 0.65 % SOLN nasal spray Place 1-2 sprays into both nostrils as needed for congestion.    . Sunscreens (SUNBLOCK SPF30 EX) Apply 1 application topically as needed (to protect from sunburn on outings).    . timolol (BETIMOL) 0.25 %  ophthalmic solution Place 1-2 drops into both eyes 2 (two) times daily.    . timolol (TIMOPTIC) 0.25 % ophthalmic solution SMARTSIG:In Eye(s)     No current facility-administered medications on file prior to visit.   Past Medical History:  Diagnosis Date  . Essential hypertension   . Mental disability   . Mixed hyperlipidemia   . Morbid obesity due to excess calories (HCC)   . Pneumonia due to SARS-associated coronavirus    No past surgical history on file.  Family History  Family history unknown: Yes   Social History   Socioeconomic History  . Marital status: Single    Spouse  name: Not on file  . Number of children: Not on file  . Years of education: Not on file  . Highest education level: Not on file  Occupational History  . Not on file  Tobacco Use  . Smoking status: Never Smoker  . Smokeless tobacco: Never Used  Substance and Sexual Activity  . Alcohol use: Never  . Drug use: Never  . Sexual activity: Not Currently  Other Topics Concern  . Not on file  Social History Narrative  . Not on file   Social Determinants of Health   Financial Resource Strain: Not on file  Food Insecurity: Not on file  Transportation Needs: Not on file  Physical Activity: Not on file  Stress: Not on file  Social Connections: Not on file    Review of Systems  Constitutional: Negative for chills, fatigue and fever.  HENT: Negative for congestion, ear pain, rhinorrhea and sore throat.   Respiratory: Negative for cough and shortness of breath.   Cardiovascular: Negative for chest pain.  Gastrointestinal: Negative for abdominal pain, constipation, diarrhea, nausea and vomiting.  Genitourinary: Negative for dysuria and urgency.  Musculoskeletal: Negative for back pain and myalgias.  Neurological: Negative for dizziness, weakness, light-headedness and headaches.  Psychiatric/Behavioral: Negative for dysphoric mood. The patient is not nervous/anxious.      Objective:  BP 104/70   Pulse 72   Temp 97.9 F (36.6 C)   Resp 18   Ht 5\' 1"  (1.549 m)   Wt 196 lb (88.9 kg)   BMI 37.03 kg/m   BP/Weight 10/16/2020 08/12/2020 05/27/2020  Systolic BP 104 124 124  Diastolic BP 70 84 68  Wt. (Lbs) 196 197 188  BMI 37.03 37.22 35.52    Physical Exam Vitals reviewed.  Constitutional:      Appearance: Normal appearance. She is normal weight.  Neck:     Vascular: No carotid bruit.  Cardiovascular:     Rate and Rhythm: Normal rate and regular rhythm.     Pulses: Normal pulses.     Heart sounds: Normal heart sounds.  Pulmonary:     Effort: Pulmonary effort is normal. No  respiratory distress.     Breath sounds: Normal breath sounds.  Abdominal:     General: Abdomen is flat. Bowel sounds are normal.     Palpations: Abdomen is soft.     Tenderness: There is no abdominal tenderness.  Neurological:     Mental Status: She is alert and oriented to person, place, and time.  Psychiatric:        Mood and Affect: Mood normal.        Behavior: Behavior normal.     Diabetic Foot Exam - Simple   No data filed      Lab Results  Component Value Date   WBC 6.5 02/25/2020   HGB 14.2 02/25/2020  HCT 42.2 02/25/2020   PLT 212 02/25/2020   GLUCOSE 101 (H) 02/25/2020   CHOL 154 01/23/2020   TRIG 73 01/23/2020   HDL 71 01/23/2020   LDLCALC 69 01/23/2020   ALT 20 02/25/2020   AST 22 02/25/2020   NA 142 02/25/2020   K 4.5 02/25/2020   CL 104 02/25/2020   CREATININE 0.72 02/25/2020   BUN 12 02/25/2020   CO2 27 02/25/2020   TSH 1.750 01/23/2020      Assessment & Plan:   1. Essential hypertension The current medical regimen is effective;  continue present plan and medications.  2. Mixed hyperlipidemia The current medical regimen is effective;  continue present plan and medications.  3. Morbid obesity (HCC) bmi 37 with two serious comorbidities. Recommend continue to work on eating healthy diet and exercise.  4. Endometrial thickening on ultrasound  Nurse called to get appointment with gyecology.   Follow-up: Return in about 3 months (around 01/13/2021).  An After Visit Summary was printed and given to the patient.  Blane Ohara, MD [facility] [phone]

## 2020-10-16 ENCOUNTER — Ambulatory Visit (INDEPENDENT_AMBULATORY_CARE_PROVIDER_SITE_OTHER): Payer: Medicare Other | Admitting: Family Medicine

## 2020-10-16 ENCOUNTER — Other Ambulatory Visit: Payer: Self-pay

## 2020-10-16 VITALS — BP 104/70 | HR 72 | Temp 97.9°F | Resp 18 | Ht 61.0 in | Wt 196.0 lb

## 2020-10-16 DIAGNOSIS — I10 Essential (primary) hypertension: Secondary | ICD-10-CM | POA: Diagnosis not present

## 2020-10-16 DIAGNOSIS — R9389 Abnormal findings on diagnostic imaging of other specified body structures: Secondary | ICD-10-CM | POA: Diagnosis not present

## 2020-10-16 DIAGNOSIS — E782 Mixed hyperlipidemia: Secondary | ICD-10-CM

## 2020-10-26 ENCOUNTER — Telehealth: Payer: Self-pay

## 2020-10-26 NOTE — Telephone Encounter (Signed)
      I reached out to [facility] in reference to a referral sent for [employer].  She states that their computer system is down today.  She did however say that since the patient missed her appointment they can call to reschedule.  She requested they call after today when their computer system is working again.  Creola Corn, LPN 77/41/42 3:95 PM

## 2020-10-26 NOTE — Telephone Encounter (Signed)
      I spoke with Gewn at the group home.  She has been trying to call [facility] to reschedule Runell's appointment however she states that she cannot get thru to them.  She states that she has also left them messages for a callback and has not heard anything.  I told her I would reach out to them in the morning again in attempt to schedule an appointment for [facility].  Creola Corn, LPN 86/76/72 0:94 PM

## 2020-10-27 NOTE — Telephone Encounter (Signed)
      Lisa Montoya has an appointment with Dr Mel Almond Monday, February 28 at 2:15.  Lisa Montoya is aware.  Creola Corn, LPN 83/25/49 8:26 AM

## 2020-11-08 ENCOUNTER — Encounter: Payer: Self-pay | Admitting: Family Medicine

## 2020-11-19 ENCOUNTER — Ambulatory Visit (INDEPENDENT_AMBULATORY_CARE_PROVIDER_SITE_OTHER): Payer: Medicare Other | Admitting: Nurse Practitioner

## 2020-11-19 ENCOUNTER — Other Ambulatory Visit: Payer: Self-pay

## 2020-11-19 ENCOUNTER — Encounter: Payer: Self-pay | Admitting: Nurse Practitioner

## 2020-11-19 VITALS — BP 148/82 | HR 69 | Resp 18 | Ht 60.0 in | Wt 184.0 lb

## 2020-11-19 DIAGNOSIS — K6289 Other specified diseases of anus and rectum: Secondary | ICD-10-CM | POA: Diagnosis not present

## 2020-11-19 DIAGNOSIS — Z0000 Encounter for general adult medical examination without abnormal findings: Secondary | ICD-10-CM | POA: Diagnosis not present

## 2020-11-19 DIAGNOSIS — Z6835 Body mass index (BMI) 35.0-35.9, adult: Secondary | ICD-10-CM | POA: Diagnosis not present

## 2020-11-19 MED ORDER — DERMACLOUD EX OINT: 1.0000 [IU] | TOPICAL_OINTMENT | Freq: Four times a day (QID) | CUTANEOUS | 1 refills | Status: DC | PRN

## 2020-11-19 NOTE — Patient Instructions (Signed)
Apply fanny cream to perirectal area as needed after bowel movements Return for paperwork packet tomorrow Follow-up appointment May 19th, 2022 at 07:45 with Dr Tobie Poet Preventive Care 83-64 Years Old, Female Preventive care refers to lifestyle choices and visits with your health care provider that can promote health and wellness. This includes:  A yearly physical exam. This is also called an annual wellness visit.  Regular dental and eye exams.  Immunizations.  Screening for certain conditions.  Healthy lifestyle choices, such as: ? Eating a healthy diet. ? Getting regular exercise. ? Not using drugs or products that contain nicotine and tobacco. ? Limiting alcohol use. What can I expect for my preventive care visit? Physical exam Your health care provider will check your:  Height and weight. These may be used to calculate your BMI (body mass index). BMI is a measurement that tells if you are at a healthy weight.  Heart rate and blood pressure.  Body temperature.  Skin for abnormal spots. Counseling Your health care provider may ask you questions about your:  Past medical problems.  Family's medical history.  Alcohol, tobacco, and drug use.  Emotional well-being.  Home life and relationship well-being.  Sexual activity.  Diet, exercise, and sleep habits.  Work and work Statistician.  Access to firearms.  Method of birth control.  Menstrual cycle.  Pregnancy history. What immunizations do I need? Vaccines are usually given at various ages, according to a schedule. Your health care provider will recommend vaccines for you based on your age, medical history, and lifestyle or other factors, such as travel or where you work.   What tests do I need? Blood tests  Lipid and cholesterol levels. These may be checked every 5 years, or more often if you are over 90 years old.  Hepatitis C test.  Hepatitis B test. Screening  Lung cancer screening. You may have this  screening every year starting at age 59 if you have a 30-pack-year history of smoking and currently smoke or have quit within the past 15 years.  Colorectal cancer screening. ? All adults should have this screening starting at age 31 and continuing until age 104. ? Your health care provider may recommend screening at age 69 if you are at increased risk. ? You will have tests every 1-10 years, depending on your results and the type of screening test.  Diabetes screening. ? This is done by checking your blood sugar (glucose) after you have not eaten for a while (fasting). ? You may have this done every 1-3 years.  Mammogram. ? This may be done every 1-2 years. ? Talk with your health care provider about when you should start having regular mammograms. This may depend on whether you have a family history of breast cancer.  BRCA-related cancer screening. This may be done if you have a family history of breast, ovarian, tubal, or peritoneal cancers.  Pelvic exam and Pap test. ? This may be done every 3 years starting at age 2. ? Starting at age 24, this may be done every 5 years if you have a Pap test in combination with an HPV test. Other tests  STD (sexually transmitted disease) testing, if you are at risk.  Bone density scan. This is done to screen for osteoporosis. You may have this scan if you are at high risk for osteoporosis. Talk with your health care provider about your test results, treatment options, and if necessary, the need for more tests. Follow these instructions at home: Eating  and drinking  Eat a diet that includes fresh fruits and vegetables, whole grains, lean protein, and low-fat dairy products.  Take vitamin and mineral supplements as recommended by your health care provider.  Do not drink alcohol if: ? Your health care provider tells you not to drink. ? You are pregnant, may be pregnant, or are planning to become pregnant.  If you drink alcohol: ? Limit how  much you have to 0-1 drink a day. ? Be aware of how much alcohol is in your drink. In the [place] one drink equals one 12 oz bottle of beer (355 mL), one 5 oz glass of wine (148 mL), or one 1 oz glass of hard liquor (44 mL).   Lifestyle  Take daily care of your teeth and gums. Brush your teeth every morning and night with fluoride toothpaste. Floss one time each day.  Stay active. Exercise for at least 30 minutes 5 or more days each week.  Do not use any products that contain nicotine or tobacco, such as cigarettes, e-cigarettes, and chewing tobacco. If you need help quitting, ask your health care provider.  Do not use drugs.  If you are sexually active, practice safe sex. Use a condom or other form of protection to prevent STIs (sexually transmitted infections).  If you do not wish to become pregnant, use a form of birth control. If you plan to become pregnant, see your health care provider for a prepregnancy visit.  If told by your health care provider, take low-dose aspirin daily starting at age 54.  Find healthy ways to cope with stress, such as: ? Meditation, yoga, or listening to music. ? Journaling. ? Talking to a trusted person. ? Spending time with friends and family. Safety  Always wear your seat belt while driving or riding in a vehicle.  Do not drive: ? If you have been drinking alcohol. Do not ride with someone who has been drinking. ? When you are tired or distracted. ? While texting.  Wear a helmet and other protective equipment during sports activities.  If you have firearms in your house, make sure you follow all gun safety procedures. What's next?  Visit your health care provider once a year for an annual wellness visit.  Ask your health care provider how often you should have your eyes and teeth checked.  Stay up to date on all vaccines. This information is not intended to replace advice given to you by your health care provider. Make sure you discuss any  questions you have with your health care provider. Document Revised: 05/26/2020 Document Reviewed: 05/03/2018 Elsevier Patient Education  2021 [place] Prevention in the Home, Adult Falls can cause injuries and can happen to people of all ages. There are many things you can do to make your home safe and to help prevent falls. Ask for help when making these changes. What actions can I take to prevent falls? General Instructions  Use good lighting in all rooms. Replace any light bulbs that burn out.  Turn on the lights in dark areas. Use night-lights.  Keep items that you use often in easy-to-reach places. Lower the shelves around your home if needed.  Set up your furniture so you have a clear path. Avoid moving your furniture around.  Do not have throw rugs or other things on the floor that can make you trip.  Avoid walking on wet floors.  If any of your floors are uneven, fix them.  Add color or contrast  paint or tape to clearly mark and help you see: ? Grab bars or handrails. ? First and last steps of staircases. ? Where the edge of each step is.  If you use a stepladder: ? Make sure that it is fully opened. Do not climb a closed stepladder. ? Make sure the sides of the stepladder are locked in place. ? Ask someone to hold the stepladder while you use it.  Know where your pets are when moving through your home. What can I do in the bathroom?  Keep the floor dry. Clean up any water on the floor right away.  Remove soap buildup in the tub or shower.  Use nonskid mats or decals on the floor of the tub or shower.  Attach bath mats securely with double-sided, nonslip rug tape.  If you need to sit down in the shower, use a plastic, nonslip stool.  Install grab bars by the toilet and in the tub and shower. Do not use towel bars as grab bars.      What can I do in the bedroom?  Make sure that you have a light by your bed that is easy to reach.  Do not use any  sheets or blankets for your bed that hang to the floor.  Have a firm chair with side arms that you can use for support when you get dressed. What can I do in the kitchen?  Clean up any spills right away.  If you need to reach something above you, use a step stool with a grab bar.  Keep electrical cords out of the way.  Do not use floor polish or wax that makes floors slippery. What can I do with my stairs?  Do not leave any items on the stairs.  Make sure that you have a light switch at the top and the bottom of the stairs.  Make sure that there are handrails on both sides of the stairs. Fix handrails that are broken or loose.  Install nonslip stair treads on all your stairs.  Avoid having throw rugs at the top or bottom of the stairs.  Choose a carpet that does not hide the edge of the steps on the stairs.  Check carpeting to make sure that it is firmly attached to the stairs. Fix carpet that is loose or worn. What can I do on the outside of my home?  Use bright outdoor lighting.  Fix the edges of walkways and driveways and fix any cracks.  Remove anything that might make you trip as you walk through a door, such as a raised step or threshold.  Trim any bushes or trees on paths to your home.  Check to see if handrails are loose or broken and that both sides of all steps have handrails.  Install guardrails along the edges of any raised decks and porches.  Clear paths of anything that can make you trip, such as tools or rocks.  Have leaves, snow, or ice cleared regularly.  Use sand or salt on paths during winter.  Clean up any spills in your garage right away. This includes grease or oil spills. What other actions can I take?  Wear shoes that: ? Have a low heel. Do not wear high heels. ? Have rubber bottoms. ? Feel good on your feet and fit well. ? Are closed at the toe. Do not wear open-toe sandals.  Use tools that help you move around if needed. These  include: ? Canes. ? Walkers. ?  Scooters. ? Crutches.  Review your medicines with your doctor. Some medicines can make you feel dizzy. This can increase your chance of falling. Ask your doctor what else you can do to help prevent falls. Where to find more information  Centers for Disease Control and Prevention, STEADI: http://www.wolf.info/  National Institute on Aging: http://kim-miller.com/ Contact a doctor if:  You are afraid of falling at home.  You feel weak, drowsy, or dizzy at home.  You fall at home. Summary  There are many simple things that you can do to make your home safe and to help prevent falls.  Ways to make your home safe include removing things that can make you trip and installing grab bars in the bathroom.  Ask for help when making these changes in your home. This information is not intended to replace advice given to you by your health care provider. Make sure you discuss any questions you have with your health care provider. Document Revised: 03/25/2020 Document Reviewed: 03/25/2020 Elsevier Patient Education  [place].

## 2020-11-19 NOTE — Progress Notes (Signed)
Subjective:  Patient ID: Lisa Montoya, female    DOB: 1956-07-25  Age: 65 y.o. MRN: [id number]  Chief Complaint  Patient presents with  . Annual Exam    Medicare    HPI Lisa Montoya is a 64 year old Caucasian female that presents for Medicare Annual Wellness exam. She has lifelong cognitive impairment, she is aphasic. She resides in an adult group home. She is accompanied by group home staff member, Fleet Contras, that is helping supplement history. Fleet Contras states Lisa Montoya is not performing good hygiene after bowel movements, she has c/o of irritation to perirectal area.    Growth percentile SmartLinks can only be used for patients less than 50 years old.   SDOH Screenings   Alcohol Screen: Not on file  Depression (IRW4-3): Not on file  Financial Resource Strain: Not on file  Food Insecurity: Not on file  Housing: Not on file  Physical Activity: Not on file  Social Connections: Not on file  Stress: Not on file  Tobacco Use: Low Risk   . Smoking Tobacco Use: Never Smoker  . Smokeless Tobacco Use: Never Used  Transportation Needs: Not on file    Fall Risk  11/19/2020  Falls in the past year? 0  Number falls in past yr: 0  Injury with Fall? 0  Risk for fall due to : History of fall(s);Impaired balance/gait  Follow up Falls prevention discussed    No flowsheet data found.  Functional Status Survey:     Safety: reviewed ;  Patient wears a seat belt. Patient's home has smoke detectors and carbon monoxide detectors. Patient practices appropriate gun safety Patient wears sunscreen with extended sun exposure. Dental Care: biannual cleanings, brushes and flosses daily. Ophthalmology/Optometry: Annual visit.  Hearing loss: none Vision impairments: none Patient is not afflicted from Stress Incontinence and Urge Incontinence   Current Outpatient Medications on File Prior to Visit  Medication Sig Dispense Refill  . acetaminophen (TYLENOL) 500 MG tablet Take 1,000 mg by mouth every 6  (six) hours as needed for mild pain.    Marland Kitchen alum & mag hydroxide-simeth (ANTACID ANTI-GAS REG STRENGTH) 200-200-20 MG/5ML suspension Take 10 mLs by mouth every 4 (four) hours as needed for indigestion or heartburn.    Marland Kitchen aspirin EC 81 MG tablet Take 81 mg by mouth daily.    Marland Kitchen atorvastatin (LIPITOR) 10 MG tablet Take 1 tablet (10 mg total) by mouth daily. 90 tablet 1  . bacitracin-polymyxin b (POLYSPORIN) ophthalmic ointment Place 2 application into the left eye every 12 (twelve) hours. apply to eye every 12 hours while awake 3.5 g 0  . bisoprolol-hydrochlorothiazide (ZIAC) 10-6.25 MG tablet Take 0.5 tablets by mouth daily. 45 tablet 0  . carbamide peroxide (DEBROX) 6.5 % OTIC solution Place 5-10 drops into both ears 2 (two) times daily as needed (for removal of ear wax for ten days).    . chlorhexidine (PERIDEX) 0.12 % solution SWISH & SPIT 1/2 OZ BY MOUTH FOR 30 SECONDS TWICE A DAY **NO REFILLS** 473 mL 10  . clotrimazole (LOTRIMIN) 1 % cream Apply 1 application topically daily as needed (ringworm, athlete's foot, jock/groin itch).    . Diethyltoluamide (OFF SMOOTH & DRY EX) Apply 1 application topically as needed (for insect repellant on outings).    . diphenhydrAMINE (BENADRYL) 25 MG tablet Take 25 mg by mouth 3 (three) times daily as needed for allergies.    . diphenhydrAMINE (SOMINEX) 25 MG tablet Take by mouth.    . hydrogen peroxide 3 % external solution Apply  1 application topically as needed (wound cleaning).    Marland Kitchen lip balm (BLISTEX) OINT Apply 1 application topically as needed for lip care.    . loperamide (IMODIUM A-D) 2 MG tablet Take 4 mg by mouth 4 (four) times daily as needed for diarrhea or loose stools.    . magnesium hydroxide (MILK OF MAGNESIA) 400 MG/5ML suspension Take 30 mLs by mouth daily as needed for mild constipation.    . Multiple Vitamin (MULTIVITAMIN) tablet Take 1 tablet by mouth daily.    Marland Kitchen neomycin-bacitracin-polymyxin (NEOSPORIN) [phone] ointment Apply 1 application  topically 3 (three) times daily as needed (wound care or skin lesions).    . permethrin (ELIMITE) 5 % cream Apply from neck down - leave on 8-10 hours and then bathe after Also wash clothes and bedsheets 60 g 0  . Phenol-Phenolate Sodium (THROAT SPRAY MT) Use as directed 2 sprays in the mouth or throat as needed (five times daily for sore throat).    . polyvinyl alcohol (LIQUIFILM TEARS) 1.4 % ophthalmic solution Place 1-2 drops into both eyes as needed for dry eyes.    Marland Kitchen selenium sulfide (SELSUN) 1 % LOTN Apply 1 application topically as needed for irritation.    . sodium chloride (OCEAN) 0.65 % SOLN nasal spray Place 1-2 sprays into both nostrils as needed for congestion.    . Sunscreens (SUNBLOCK SPF30 EX) Apply 1 application topically as needed (to protect from sunburn on outings).    . timolol (BETIMOL) 0.25 % ophthalmic solution Place 1-2 drops into both eyes 2 (two) times daily.    . timolol (TIMOPTIC) 0.25 % ophthalmic solution SMARTSIG:In Eye(s)     No current facility-administered medications on file prior to visit.    Social Hx   Social History   Socioeconomic History  . Marital status: Single    Spouse name: Not on file  . Number of children: Not on file  . Years of education: Not on file  . Highest education level: Not on file  Occupational History  . Not on file  Tobacco Use  . Smoking status: Never Smoker  . Smokeless tobacco: Never Used  Substance and Sexual Activity  . Alcohol use: Never  . Drug use: Never  . Sexual activity: Not Currently  Other Topics Concern  . Not on file  Social History Narrative  . Not on file   Social Determinants of Health   Financial Resource Strain: Not on file  Food Insecurity: Not on file  Transportation Needs: Not on file  Physical Activity: Not on file  Stress: Not on file  Social Connections: Not on file   Past Medical History:  Diagnosis Date  . Essential hypertension   . Mental disability   . Mixed hyperlipidemia   .  Morbid obesity due to excess calories (HCC)   . Pneumonia due to SARS-associated coronavirus    Family History  Family history unknown: Yes    Review of Systems  Unable to perform ROS: Patient nonverbal     Objective:  BP (!) 148/82   Pulse 69   Resp 18   Ht 5' (1.524 m)   Wt 184 lb (83.5 kg)   SpO2 99%   BMI 35.94 kg/m   BP/Weight 11/19/2020 10/16/2020 08/12/2020  Systolic BP 148 104 124  Diastolic BP 82 70 84  Wt. (Lbs) 184 196 197  BMI 35.94 37.03 37.22    Physical Exam Vitals reviewed.  HENT:     Head: Normocephalic.  Skin:  General: Skin is warm and dry.     Capillary Refill: Capillary refill takes less than 2 seconds.  Neurological:     Mental Status: She is alert. Mental status is at baseline.     Lab Results  Component Value Date   WBC 6.5 02/25/2020   HGB 14.2 02/25/2020   HCT 42.2 02/25/2020   PLT 212 02/25/2020   GLUCOSE 101 (H) 02/25/2020   CHOL 154 01/23/2020   TRIG 73 01/23/2020   HDL 71 01/23/2020   LDLCALC 69 01/23/2020   ALT 20 02/25/2020   AST 22 02/25/2020   NA 142 02/25/2020   K 4.5 02/25/2020   CL 104 02/25/2020   CREATININE 0.72 02/25/2020   BUN 12 02/25/2020   CO2 27 02/25/2020   TSH 1.750 01/23/2020      Assessment & Plan:   1. Medicare annual wellness visit, subsequent  2. Perirectal skin irritation - Infant Care Products (DERMACLOUD) OINT; Apply 1 Units topically 4 (four) times daily as needed.  Dispense: 430 g; Refill: 1  3. Adult BMI 35.0-35.9 kg/sq m    These are the goals we discussed: Goals   Increase physical activity      This is a list of the screening recommended for you and due dates:  Health Maintenance  Topic Date Due  .  Hepatitis C: One time screening is recommended by Center for Disease Control  (CDC) for  adults born from [birth year] through 1964.   Never done  . Colon Cancer Screening  04/12/2021  . Mammogram  07/10/2021  . Tetanus Vaccine  09/26/2022  . Flu Shot  Completed  . COVID-19 Vaccine   Completed  . HIV Screening  Completed  . HPV Vaccine  Aged Out  . Pap Smear  Discontinued    Apply fanny cream to perirectal area as needed after bowel movements Return for paperwork packet tomorrow Follow-up appointment May 19th, 2022 at 07:45 with Dr Sedalia Muta  AN INDIVIDUALIZED CARE PLAN: was established or reinforced today.   SELF MANAGEMENT: The patient and I together assessed ways to personally work towards obtaining the recommended goals  Support needs The patient and/or family needs were assessed and services were offered and not necessary at this time.    Follow-up: May 19th, 2022  Signed, Janie Morning [facility] [phone]

## 2020-12-02 ENCOUNTER — Other Ambulatory Visit: Payer: Self-pay

## 2020-12-02 ENCOUNTER — Ambulatory Visit (INDEPENDENT_AMBULATORY_CARE_PROVIDER_SITE_OTHER): Payer: Medicare Other | Admitting: Family Medicine

## 2020-12-02 VITALS — BP 132/68 | HR 67 | Temp 97.4°F | Ht 61.0 in | Wt 188.0 lb

## 2020-12-02 DIAGNOSIS — R600 Localized edema: Secondary | ICD-10-CM

## 2020-12-02 NOTE — Patient Instructions (Signed)
Low salt diet.  Elevated legs.  Wear compression socks/stockings.

## 2020-12-02 NOTE — Progress Notes (Signed)
Acute Office Visit  Subjective:    Patient ID: Lisa Montoya, female    DOB: [birth year], 65 y.o.   MRN: [id number]  Chief Complaint  Patient presents with  . Edema    HPI Patient is in today for edema of lower extremities. Caregiver is unable to provide any information regarding patient and the swelling in her legs. She does not know when the swelling started as she "was not the one who put the order/request in". Per Caregiver she only knows of patients legs to swell when she is in a rocking chair and states the swelling resolves after patient elevates them..   Past Medical History:  Diagnosis Date  . Essential hypertension   . Mental disability   . Mixed hyperlipidemia   . Morbid obesity due to excess calories (HCC)   . Pneumonia due to SARS-associated coronavirus     No past surgical history on file.  Family History  Family history unknown: Yes    Social History   Socioeconomic History  . Marital status: Single    Spouse name: Not on file  . Number of children: Not on file  . Years of education: Not on file  . Highest education level: Not on file  Occupational History  . Not on file  Tobacco Use  . Smoking status: Never Smoker  . Smokeless tobacco: Never Used  Substance and Sexual Activity  . Alcohol use: Never  . Drug use: Never  . Sexual activity: Not Currently  Other Topics Concern  . Not on file  Social History Narrative  . Not on file   Social Determinants of Health   Financial Resource Strain: Low Risk   . Difficulty of Paying Living Expenses: Not hard at all  Food Insecurity: No Food Insecurity  . Worried About Programme researcher, broadcasting/film/video in the Last Year: Never true  . Ran Out of Food in the Last Year: Never true  Transportation Needs: Not on file  Physical Activity: Unknown  . Days of Exercise per Week: 5 days  . Minutes of Exercise per Session: Not on file  Stress: Not on file  Social Connections: Unknown  . Frequency of Communication with  Friends and Family: Not on file  . Frequency of Social Gatherings with Friends and Family: Once a week  . Attends Religious Services: Never  . Active Member of Clubs or Organizations: No  . Attends Banker Meetings: Never  . Marital Status: Never married  Intimate Partner Violence: Not At Risk  . Fear of Current or Ex-Partner: No  . Emotionally Abused: No  . Physically Abused: No  . Sexually Abused: No    Outpatient Medications Prior to Visit  Medication Sig Dispense Refill  . acetaminophen (TYLENOL) 500 MG tablet Take 1,000 mg by mouth every 6 (six) hours as needed for mild pain.    Marland Kitchen alum & mag hydroxide-simeth (ANTACID ANTI-GAS REG STRENGTH) 200-200-20 MG/5ML suspension Take 10 mLs by mouth every 4 (four) hours as needed for indigestion or heartburn.    Marland Kitchen aspirin EC 81 MG tablet Take 81 mg by mouth daily.    Marland Kitchen atorvastatin (LIPITOR) 10 MG tablet Take 1 tablet (10 mg total) by mouth daily. 90 tablet 1  . bacitracin-polymyxin b (POLYSPORIN) ophthalmic ointment Place 2 application into the left eye every 12 (twelve) hours. apply to eye every 12 hours while awake 3.5 g 0  . bisoprolol-hydrochlorothiazide (ZIAC) 10-6.25 MG tablet Take 0.5 tablets by mouth daily. 45 tablet 0  .  carbamide peroxide (DEBROX) 6.5 % OTIC solution Place 5-10 drops into both ears 2 (two) times daily as needed (for removal of ear wax for ten days).    . chlorhexidine (PERIDEX) 0.12 % solution SWISH & SPIT 1/2 OZ BY MOUTH FOR 30 SECONDS TWICE A DAY **NO REFILLS** 473 mL 10  . clotrimazole (LOTRIMIN) 1 % cream Apply 1 application topically daily as needed (ringworm, athlete's foot, jock/groin itch).    . Diethyltoluamide (OFF SMOOTH & DRY EX) Apply 1 application topically as needed (for insect repellant on outings).    . diphenhydrAMINE (BENADRYL) 25 MG tablet Take 25 mg by mouth 3 (three) times daily as needed for allergies.    . diphenhydrAMINE (SOMINEX) 25 MG tablet Take by mouth.    . hydrogen peroxide  3 % external solution Apply 1 application topically as needed (wound cleaning).    . Infant Care Products (DERMACLOUD) OINT Apply 1 Units topically 4 (four) times daily as needed. 430 g 1  . lip balm (BLISTEX) OINT Apply 1 application topically as needed for lip care.    . loperamide (IMODIUM A-D) 2 MG tablet Take 4 mg by mouth 4 (four) times daily as needed for diarrhea or loose stools.    . magnesium hydroxide (MILK OF MAGNESIA) 400 MG/5ML suspension Take 30 mLs by mouth daily as needed for mild constipation.    . Multiple Vitamin (MULTIVITAMIN) tablet Take 1 tablet by mouth daily.    Marland Kitchen neomycin-bacitracin-polymyxin (NEOSPORIN) [phone] ointment Apply 1 application topically 3 (three) times daily as needed (wound care or skin lesions).    . permethrin (ELIMITE) 5 % cream Apply from neck down - leave on 8-10 hours and then bathe after Also wash clothes and bedsheets 60 g 0  . Phenol-Phenolate Sodium (THROAT SPRAY MT) Use as directed 2 sprays in the mouth or throat as needed (five times daily for sore throat).    . polyvinyl alcohol (LIQUIFILM TEARS) 1.4 % ophthalmic solution Place 1-2 drops into both eyes as needed for dry eyes.    Marland Kitchen selenium sulfide (SELSUN) 1 % LOTN Apply 1 application topically as needed for irritation.    . sodium chloride (OCEAN) 0.65 % SOLN nasal spray Place 1-2 sprays into both nostrils as needed for congestion.    . Sunscreens (SUNBLOCK SPF30 EX) Apply 1 application topically as needed (to protect from sunburn on outings).    . timolol (BETIMOL) 0.25 % ophthalmic solution Place 1-2 drops into both eyes 2 (two) times daily.    . timolol (TIMOPTIC) 0.25 % ophthalmic solution SMARTSIG:In Eye(s)     No facility-administered medications prior to visit.    No Known Allergies  Review of Systems  Constitutional: Negative for chills, fatigue and fever.  HENT: Negative for congestion, ear pain, postnasal drip, rhinorrhea, sinus pressure, sinus pain and sore throat.    Respiratory: Negative for cough and shortness of breath.   Cardiovascular: Positive for leg swelling. Negative for chest pain.       Objective:    Physical Exam Vitals reviewed.  Constitutional:      Appearance: Normal appearance. She is obese.  Cardiovascular:     Rate and Rhythm: Normal rate and regular rhythm.     Heart sounds: Normal heart sounds.  Pulmonary:     Effort: Pulmonary effort is normal.     Breath sounds: Normal breath sounds.  Musculoskeletal:     Right lower leg: Edema (1 +) present.     Left lower leg: Edema (1 +)  present.  Neurological:     Mental Status: She is alert.     BP 132/68   Pulse 67   Temp (!) 97.4 F (36.3 C)   Ht 5\' 1"  (1.549 m)   Wt 188 lb (85.3 kg)   SpO2 93%   BMI 35.52 kg/m  Wt Readings from Last 3 Encounters:  12/02/20 188 lb (85.3 kg)  11/19/20 184 lb (83.5 kg)  10/16/20 196 lb (88.9 kg)    Health Maintenance Due  Topic Date Due  . Hepatitis C Screening  Never done    There are no preventive care reminders to display for this patient.   Lab Results  Component Value Date   TSH 1.750 01/23/2020   Lab Results  Component Value Date   WBC 6.5 02/25/2020   HGB 14.2 02/25/2020   HCT 42.2 02/25/2020   MCV 94 02/25/2020   PLT 212 02/25/2020   Lab Results  Component Value Date   NA 142 02/25/2020   K 4.5 02/25/2020   CO2 27 02/25/2020   GLUCOSE 101 (H) 02/25/2020   BUN 12 02/25/2020   CREATININE 0.72 02/25/2020   BILITOT 0.2 02/25/2020   ALKPHOS 95 02/25/2020   AST 22 02/25/2020   ALT 20 02/25/2020   PROT 7.1 02/25/2020   ALBUMIN 4.5 02/25/2020   CALCIUM 10.0 02/25/2020   ANIONGAP 10 06/16/2019   Lab Results  Component Value Date   CHOL 154 01/23/2020   Lab Results  Component Value Date   HDL 71 01/23/2020   Lab Results  Component Value Date   LDLCALC 69 01/23/2020   Lab Results  Component Value Date   TRIG 73 01/23/2020   Lab Results  Component Value Date   CHOLHDL 2.2 01/23/2020   No  results found for: HGBA1C     Assessment & Plan:  1. Pedal edema Low salt diet.  Elevated legs.  Wear compression socks/stockings.    Follow-up: Return if symptoms worsen or fail to improve.  An After Visit Summary was printed and given to the patient.  01/25/2020, MD [facility] [phone]

## 2020-12-05 ENCOUNTER — Encounter: Payer: Self-pay | Admitting: Family Medicine

## 2020-12-15 ENCOUNTER — Other Ambulatory Visit: Payer: Self-pay

## 2020-12-15 ENCOUNTER — Encounter: Payer: Self-pay | Admitting: Sports Medicine

## 2020-12-15 ENCOUNTER — Ambulatory Visit (INDEPENDENT_AMBULATORY_CARE_PROVIDER_SITE_OTHER): Payer: Medicare Other | Admitting: Sports Medicine

## 2020-12-15 DIAGNOSIS — I739 Peripheral vascular disease, unspecified: Secondary | ICD-10-CM | POA: Diagnosis not present

## 2020-12-15 DIAGNOSIS — S92404A Nondisplaced unspecified fracture of right great toe, initial encounter for closed fracture: Secondary | ICD-10-CM | POA: Diagnosis not present

## 2020-12-15 DIAGNOSIS — S90111A Contusion of right great toe without damage to nail, initial encounter: Secondary | ICD-10-CM | POA: Diagnosis not present

## 2020-12-15 DIAGNOSIS — M79674 Pain in right toe(s): Secondary | ICD-10-CM

## 2020-12-15 DIAGNOSIS — F79 Unspecified intellectual disabilities: Secondary | ICD-10-CM

## 2020-12-15 NOTE — Progress Notes (Signed)
Subjective: Lisa Montoya is a 65 y.o. female patient who presents to office for evaluation of Right great toe pain.  Patient is assisted by caregiver who states that they are not sure how she did it but they think that she had a fall and bumped the toe went for x-rays on 4/9 at [facility] which displayed a fracture and they gave her a surgical shoe but she does not recall what they did with that shoe.  Patient Active Problem List   Diagnosis Date Noted  . Scabies 03/11/2020  . Falls frequently 02/25/2020  . BMI 40.0-44.9, adult (HCC) 02/25/2020  . Morbid obesity (HCC) 02/25/2020  . Bruise, trunk, initial encounter 02/25/2020  . Mixed hyperlipidemia 01/16/2020  . Elevated ferritin 01/16/2020  . Bilateral impacted cerumen 01/16/2020  . Intellectual disability 06/11/2019  . HTN (hypertension) 06/11/2019    Current Outpatient Medications on File Prior to Visit  Medication Sig Dispense Refill  . acetaminophen (TYLENOL) 500 MG tablet Take 1,000 mg by mouth every 6 (six) hours as needed for mild pain.    Marland Kitchen alum & mag hydroxide-simeth (ANTACID ANTI-GAS REG STRENGTH) 200-200-20 MG/5ML suspension Take 10 mLs by mouth every 4 (four) hours as needed for indigestion or heartburn.    Marland Kitchen aspirin EC 81 MG tablet Take 81 mg by mouth daily.    Marland Kitchen atorvastatin (LIPITOR) 10 MG tablet Take 1 tablet (10 mg total) by mouth daily. 90 tablet 1  . bacitracin-polymyxin b (POLYSPORIN) ophthalmic ointment Place 2 application into the left eye every 12 (twelve) hours. apply to eye every 12 hours while awake 3.5 g 0  . bisoprolol-hydrochlorothiazide (ZIAC) 10-6.25 MG tablet Take 0.5 tablets by mouth daily. 45 tablet 0  . carbamide peroxide (DEBROX) 6.5 % OTIC solution Place 5-10 drops into both ears 2 (two) times daily as needed (for removal of ear wax for ten days).    . chlorhexidine (PERIDEX) 0.12 % solution SWISH & SPIT 1/2 OZ BY MOUTH FOR 30 SECONDS TWICE A DAY **NO REFILLS** 473 mL 10  . clotrimazole  (LOTRIMIN) 1 % cream Apply 1 application topically daily as needed (ringworm, athlete's foot, jock/groin itch).    . Diethyltoluamide (OFF SMOOTH & DRY EX) Apply 1 application topically as needed (for insect repellant on outings).    . diphenhydrAMINE (BENADRYL) 25 MG tablet Take 25 mg by mouth 3 (three) times daily as needed for allergies.    . diphenhydrAMINE (SOMINEX) 25 MG tablet Take by mouth.    . hydrogen peroxide 3 % external solution Apply 1 application topically as needed (wound cleaning).    . Infant Care Products (DERMACLOUD) OINT Apply 1 Units topically 4 (four) times daily as needed. 430 g 1  . lip balm (BLISTEX) OINT Apply 1 application topically as needed for lip care.    . loperamide (IMODIUM A-D) 2 MG tablet Take 4 mg by mouth 4 (four) times daily as needed for diarrhea or loose stools.    . magnesium hydroxide (MILK OF MAGNESIA) 400 MG/5ML suspension Take 30 mLs by mouth daily as needed for mild constipation.    . Multiple Vitamin (MULTIVITAMIN) tablet Take 1 tablet by mouth daily.    Marland Kitchen neomycin-bacitracin-polymyxin (NEOSPORIN) [phone] ointment Apply 1 application topically 3 (three) times daily as needed (wound care or skin lesions).    . permethrin (ELIMITE) 5 % cream Apply from neck down - leave on 8-10 hours and then bathe after Also wash clothes and bedsheets 60 g 0  . Phenol-Phenolate Sodium (THROAT  SPRAY MT) Use as directed 2 sprays in the mouth or throat as needed (five times daily for sore throat).    . polyvinyl alcohol (LIQUIFILM TEARS) 1.4 % ophthalmic solution Place 1-2 drops into both eyes as needed for dry eyes.    Marland Kitchen selenium sulfide (SELSUN) 1 % LOTN Apply 1 application topically as needed for irritation.    . sodium chloride (OCEAN) 0.65 % SOLN nasal spray Place 1-2 sprays into both nostrils as needed for congestion.    . Sunscreens (SUNBLOCK SPF30 EX) Apply 1 application topically as needed (to protect from sunburn on outings).    . timolol (BETIMOL) 0.25 %  ophthalmic solution Place 1-2 drops into both eyes 2 (two) times daily.    . timolol (TIMOPTIC) 0.25 % ophthalmic solution SMARTSIG:In Eye(s)     No current facility-administered medications on file prior to visit.    No Known Allergies  Objective:  General: Alert and oriented x2 in no acute distress however patient is noted to be whimpering during visit  Dermatology: No open lesions bilateral lower extremities, no webspace macerations, and abrasion noted at the base of the right great toenail with no active bleeding or signs of infection.  Minimal ecchymosis noted to the first toe on the right.  All nails x 10 are well manicured.  Vascular: Focal edema noted to right foot at the great toe.  Neurology: Unable to discern due to patient status.  Musculoskeletal: Guarding with light touch due to pain likely at right great toe.  Gait: Unassisted, Antalgic gait   Assessment and Plan: Problem List Items Addressed This Visit      Other   Intellectual disability    Other Visit Diagnoses    Contusion of right great toe without damage to nail, initial encounter    -  Primary   Closed nondisplaced fracture of phalanx of right great toe, unspecified phalanx, initial encounter       Distal   Toe pain, right       PVD (peripheral vascular disease) (HCC)           -Complete examination performed -Xrays reviewed from [facility] which reveals a nondisplaced fracture of the first distal phalanx of the right great toe as reviewed on 12/12/2020 -Discussed continued care for fracture; risks, alternatives, and benefits explained. -Applied antibiotic cream and Band-Aid to right great toe and advised them to do the same until the abrasion has healed -Dispensed surgical shoe for patient to wear upon ambulation to protect toe and to allow time for the fracture to heal -Recommend protection, rest, ice, elevation daily until symptoms improve -Patient to return to office in 4 weeks for serial x-rays to  assess healing  or sooner if condition worsens. -Advised caretaker from facility that patient may return to work on light duty on Monday 4/18 as long as she can wear the surgical shoe.  Asencion Islam, DPM

## 2020-12-15 NOTE — Patient Instructions (Signed)
Orders Apply antibiotic cream and bandaid to right great toe once daily Must wear surgical shoe when attempting to walk until her next office visit in 1 month May return to work on Monday 4/18 light duty as long as she can wear surgical shoe to protect her fracture at the right great toe

## 2020-12-21 ENCOUNTER — Encounter: Payer: Self-pay | Admitting: Podiatry

## 2021-01-06 ENCOUNTER — Ambulatory Visit: Payer: Medicare Other | Admitting: Sports Medicine

## 2021-01-20 NOTE — Progress Notes (Signed)
Subjective:  Patient ID: Lisa Montoya, female    DOB: Dec 10, 1955  Age: 65 y.o. MRN: [id number]  Chief Complaint  Patient presents with  . Hyperlipidemia  . Hypertension    HPI  Essential hypertension Taking bisoprolol/hctz 10/6.25 0.5 mg once daily.  Mixed hyperlipidemia Takes lipitor daily and maintains healthy diet. Appetite is good. No exercising. Patient does not have good balance.   History of rt great toe fracture. Occurred 12/12/2020 and she is following up with Dr. Marylene Land, podiatry. Due to see tomorrow. Has surgical shoe.  Goes to work Tax inspector) is having more bowel accidents. Wears pullups. Not constipation/diaarhea.  Mentally disabled. Poor verbalization skills. Balance is off.   Current Outpatient Medications on File Prior to Visit  Medication Sig Dispense Refill  . acetaminophen (TYLENOL) 500 MG tablet Take 1,000 mg by mouth every 6 (six) hours as needed for mild pain.    Marland Kitchen alum & mag hydroxide-simeth (ANTACID ANTI-GAS REG STRENGTH) 200-200-20 MG/5ML suspension Take 10 mLs by mouth every 4 (four) hours as needed for indigestion or heartburn.    Marland Kitchen aspirin EC 81 MG tablet Take 81 mg by mouth daily.    Marland Kitchen atorvastatin (LIPITOR) 10 MG tablet Take 1 tablet (10 mg total) by mouth daily. 90 tablet 1  . bacitracin-polymyxin b (POLYSPORIN) ophthalmic ointment Place 2 application into the left eye every 12 (twelve) hours. apply to eye every 12 hours while awake 3.5 g 0  . bisoprolol-hydrochlorothiazide (ZIAC) 10-6.25 MG tablet Take 0.5 tablets by mouth daily. 45 tablet 0  . carbamide peroxide (DEBROX) 6.5 % OTIC solution Place 5-10 drops into both ears 2 (two) times daily as needed (for removal of ear wax for ten days).    . chlorhexidine (PERIDEX) 0.12 % solution SWISH & SPIT 1/2 OZ BY MOUTH FOR 30 SECONDS TWICE A DAY **NO REFILLS** 473 mL 10  . clotrimazole (LOTRIMIN) 1 % cream Apply 1 application topically daily as needed (ringworm, athlete's foot, jock/groin itch).     . Diethyltoluamide (OFF SMOOTH & DRY EX) Apply 1 application topically as needed (for insect repellant on outings).    . diphenhydrAMINE (BENADRYL) 25 MG tablet Take 25 mg by mouth 3 (three) times daily as needed for allergies.    . diphenhydrAMINE (SOMINEX) 25 MG tablet Take by mouth.    . hydrogen peroxide 3 % external solution Apply 1 application topically as needed (wound cleaning).    . Infant Care Products (DERMACLOUD) OINT Apply 1 Units topically 4 (four) times daily as needed. 430 g 1  . lip balm (BLISTEX) OINT Apply 1 application topically as needed for lip care.    . loperamide (IMODIUM A-D) 2 MG tablet Take 4 mg by mouth 4 (four) times daily as needed for diarrhea or loose stools.    . magnesium hydroxide (MILK OF MAGNESIA) 400 MG/5ML suspension Take 30 mLs by mouth daily as needed for mild constipation.    . Multiple Vitamin (MULTIVITAMIN) tablet Take 1 tablet by mouth daily.    Marland Kitchen neomycin-bacitracin-polymyxin (NEOSPORIN) [phone] ointment Apply 1 application topically 3 (three) times daily as needed (wound care or skin lesions).    . permethrin (ELIMITE) 5 % cream Apply from neck down - leave on 8-10 hours and then bathe after Also wash clothes and bedsheets 60 g 0  . Phenol-Phenolate Sodium (THROAT SPRAY MT) Use as directed 2 sprays in the mouth or throat as needed (five times daily for sore throat).    . polyvinyl alcohol (LIQUIFILM TEARS)  1.4 % ophthalmic solution Place 1-2 drops into both eyes as needed for dry eyes.    Marland Kitchen selenium sulfide (SELSUN) 1 % LOTN Apply 1 application topically as needed for irritation.    . sodium chloride (OCEAN) 0.65 % SOLN nasal spray Place 1-2 sprays into both nostrils as needed for congestion.    . Sunscreens (SUNBLOCK SPF30 EX) Apply 1 application topically as needed (to protect from sunburn on outings).    . timolol (BETIMOL) 0.25 % ophthalmic solution Place 1-2 drops into both eyes 2 (two) times daily.    . timolol (TIMOPTIC) 0.25 % ophthalmic  solution SMARTSIG:In Eye(s)     No current facility-administered medications on file prior to visit.   Past Medical History:  Diagnosis Date  . Essential hypertension   . Mental disability   . Mixed hyperlipidemia   . Morbid obesity due to excess calories (HCC)   . Pneumonia due to SARS-associated coronavirus    History reviewed. No pertinent surgical history.  Family History  Family history unknown: Yes   Social History   Socioeconomic History  . Marital status: Single    Spouse name: Not on file  . Number of children: Not on file  . Years of education: Not on file  . Highest education level: Not on file  Occupational History  . Not on file  Tobacco Use  . Smoking status: Never Smoker  . Smokeless tobacco: Never Used  Substance and Sexual Activity  . Alcohol use: Never  . Drug use: Never  . Sexual activity: Not Currently  Other Topics Concern  . Not on file  Social History Narrative  . Not on file   Social Determinants of Health   Financial Resource Strain: Low Risk   . Difficulty of Paying Living Expenses: Not hard at all  Food Insecurity: No Food Insecurity  . Worried About Programme researcher, broadcasting/film/video in the Last Year: Never true  . Ran Out of Food in the Last Year: Never true  Transportation Needs: Not on file  Physical Activity: Unknown  . Days of Exercise per Week: 5 days  . Minutes of Exercise per Session: Not on file  Stress: Not on file  Social Connections: Unknown  . Frequency of Communication with Friends and Family: Not on file  . Frequency of Social Gatherings with Friends and Family: Once a week  . Attends Religious Services: Never  . Active Member of Clubs or Organizations: No  . Attends Banker Meetings: Never  . Marital Status: Never married    Review of Systems  Constitutional: Negative for chills, fatigue and fever.  HENT: Negative for congestion, ear pain, rhinorrhea and sore throat.   Respiratory: Negative for cough and shortness  of breath.   Cardiovascular: Negative for chest pain.  Gastrointestinal: Negative for abdominal pain, constipation, diarrhea, nausea and vomiting.  Genitourinary: Negative for dysuria and urgency.  Musculoskeletal: Negative for back pain and myalgias.  Neurological: Negative for weakness, light-headedness and headaches.       Imbalance  Psychiatric/Behavioral: Negative for dysphoric mood. The patient is not nervous/anxious.      Objective:  BP 106/70   Pulse 80   Temp (!) 97.4 F (36.3 C)   Resp 18   Ht 5\' 1"  (1.549 m)   Wt 196 lb 3.2 oz (89 kg)   BMI 37.07 kg/m   BP/Weight 01/21/2021 12/02/2020 11/19/2020  Systolic BP 106 132 148  Diastolic BP 70 68 82  Wt. (Lbs) 196.2 188 184  BMI 37.07 35.52 35.94    Physical Exam Vitals reviewed.  Constitutional:      Appearance: Normal appearance. She is obese.  HENT:     Right Ear: There is impacted cerumen.     Left Ear: There is impacted cerumen.     Nose: Nose normal.     Mouth/Throat:     Pharynx: No oropharyngeal exudate or posterior oropharyngeal erythema.  Neck:     Vascular: No carotid bruit.  Cardiovascular:     Rate and Rhythm: Normal rate and regular rhythm.     Pulses: Normal pulses.     Heart sounds: Normal heart sounds.  Pulmonary:     Effort: Pulmonary effort is normal. No respiratory distress.     Breath sounds: Normal breath sounds.  Abdominal:     General: Abdomen is flat. Bowel sounds are normal.     Palpations: Abdomen is soft.     Tenderness: There is no abdominal tenderness.  Neurological:     Mental Status: She is alert and oriented to person, place, and time.     Gait: Gait abnormal (Wide based).  Psychiatric:        Mood and Affect: Mood normal.        Behavior: Behavior normal.     Diabetic Foot Exam - Simple   No data filed      Lab Results  Component Value Date   WBC 6.5 02/25/2020   HGB 14.2 02/25/2020   HCT 42.2 02/25/2020   PLT 212 02/25/2020   GLUCOSE 101 (H) 02/25/2020   CHOL  154 01/23/2020   TRIG 73 01/23/2020   HDL 71 01/23/2020   LDLCALC 69 01/23/2020   ALT 20 02/25/2020   AST 22 02/25/2020   NA 142 02/25/2020   K 4.5 02/25/2020   CL 104 02/25/2020   CREATININE 0.72 02/25/2020   BUN 12 02/25/2020   CO2 27 02/25/2020   TSH 1.750 01/23/2020      Assessment & Plan:   1. Essential hypertension Well controlled.  No medication changes recommended. Continue healthy diet and exercise.  - cbc, cmp, tsh  2. Mixed hyperlipidemia Continue to eat a healthy diet and exercise. - lipid panel  3. Intellectual disability  Continue at group home.   4. Bilateral impacted cerumen Irrigation BL. Successful.  5. Morbid obesity due to excess calories with serious comorbidity and body mass index (BMI) of 37.0 to 37.9 in adult  Recommend continue to work on eating healthy diet and exercise.  7. Other fracture of left great toe, initial encounter for closed fracture Follow up with Dr. Marylene Land   8. Imbalance/Gait abnormality Refer to physical therapy.   Follow-up: Return in about 3 months (around 04/23/2021) for not fasting.  An After Visit Summary was printed and given to the patient.  Blane Ohara, MD [facility] [phone]

## 2021-01-21 ENCOUNTER — Ambulatory Visit (INDEPENDENT_AMBULATORY_CARE_PROVIDER_SITE_OTHER): Payer: Medicare Other | Admitting: Family Medicine

## 2021-01-21 ENCOUNTER — Other Ambulatory Visit: Payer: Self-pay

## 2021-01-21 ENCOUNTER — Encounter: Payer: Self-pay | Admitting: Family Medicine

## 2021-01-21 VITALS — BP 106/70 | HR 80 | Temp 97.4°F | Resp 18 | Ht 61.0 in | Wt 196.2 lb

## 2021-01-21 DIAGNOSIS — Z6837 Body mass index (BMI) 37.0-37.9, adult: Secondary | ICD-10-CM

## 2021-01-21 DIAGNOSIS — F79 Unspecified intellectual disabilities: Secondary | ICD-10-CM | POA: Diagnosis not present

## 2021-01-21 DIAGNOSIS — H6123 Impacted cerumen, bilateral: Secondary | ICD-10-CM

## 2021-01-21 DIAGNOSIS — I10 Essential (primary) hypertension: Secondary | ICD-10-CM

## 2021-01-21 DIAGNOSIS — E782 Mixed hyperlipidemia: Secondary | ICD-10-CM | POA: Diagnosis not present

## 2021-01-21 DIAGNOSIS — R269 Unspecified abnormalities of gait and mobility: Secondary | ICD-10-CM

## 2021-01-21 DIAGNOSIS — S92492A Other fracture of left great toe, initial encounter for closed fracture: Secondary | ICD-10-CM

## 2021-01-21 DIAGNOSIS — R2689 Other abnormalities of gait and mobility: Secondary | ICD-10-CM

## 2021-01-22 ENCOUNTER — Encounter: Payer: Self-pay | Admitting: Sports Medicine

## 2021-01-22 ENCOUNTER — Ambulatory Visit (INDEPENDENT_AMBULATORY_CARE_PROVIDER_SITE_OTHER): Payer: Medicare Other

## 2021-01-22 ENCOUNTER — Ambulatory Visit (INDEPENDENT_AMBULATORY_CARE_PROVIDER_SITE_OTHER): Payer: Medicare Other | Admitting: Sports Medicine

## 2021-01-22 DIAGNOSIS — M79674 Pain in right toe(s): Secondary | ICD-10-CM

## 2021-01-22 DIAGNOSIS — B351 Tinea unguium: Secondary | ICD-10-CM | POA: Diagnosis not present

## 2021-01-22 DIAGNOSIS — S90111D Contusion of right great toe without damage to nail, subsequent encounter: Secondary | ICD-10-CM

## 2021-01-22 DIAGNOSIS — M79609 Pain in unspecified limb: Secondary | ICD-10-CM | POA: Diagnosis not present

## 2021-01-22 DIAGNOSIS — I739 Peripheral vascular disease, unspecified: Secondary | ICD-10-CM

## 2021-01-22 DIAGNOSIS — S92404D Nondisplaced unspecified fracture of right great toe, subsequent encounter for fracture with routine healing: Secondary | ICD-10-CM

## 2021-01-22 DIAGNOSIS — S90111A Contusion of right great toe without damage to nail, initial encounter: Secondary | ICD-10-CM

## 2021-01-22 DIAGNOSIS — F79 Unspecified intellectual disabilities: Secondary | ICD-10-CM

## 2021-01-22 DIAGNOSIS — L853 Xerosis cutis: Secondary | ICD-10-CM

## 2021-01-22 LAB — CBC WITH DIFFERENTIAL/PLATELET
Basophils Absolute: 0 10*3/uL (ref 0.0–0.2)
Basos: 0 %
EOS (ABSOLUTE): 0.1 10*3/uL (ref 0.0–0.4)
Eos: 1 %
Hematocrit: 42.7 % (ref 34.0–46.6)
Hemoglobin: 14.5 g/dL (ref 11.1–15.9)
Immature Grans (Abs): 0 10*3/uL (ref 0.0–0.1)
Immature Granulocytes: 0 %
Lymphocytes Absolute: 1.2 10*3/uL (ref 0.7–3.1)
Lymphs: 21 %
MCH: 31.1 pg (ref 26.6–33.0)
MCHC: 34 g/dL (ref 31.5–35.7)
MCV: 92 fL (ref 79–97)
Monocytes Absolute: 0.4 10*3/uL (ref 0.1–0.9)
Monocytes: 7 %
Neutrophils Absolute: 4.2 10*3/uL (ref 1.4–7.0)
Neutrophils: 71 %
Platelets: 233 10*3/uL (ref 150–450)
RBC: 4.66 x10E6/uL (ref 3.77–5.28)
RDW: 12.7 % (ref 11.7–15.4)
WBC: 5.9 10*3/uL (ref 3.4–10.8)

## 2021-01-22 LAB — COMPREHENSIVE METABOLIC PANEL
ALT: 17 IU/L (ref 0–32)
AST: 19 IU/L (ref 0–40)
Albumin/Globulin Ratio: 1.8 (ref 1.2–2.2)
Albumin: 4.4 g/dL (ref 3.8–4.8)
Alkaline Phosphatase: 104 IU/L (ref 44–121)
BUN/Creatinine Ratio: 14 (ref 12–28)
BUN: 12 mg/dL (ref 8–27)
Bilirubin Total: 0.5 mg/dL (ref 0.0–1.2)
CO2: 26 mmol/L (ref 20–29)
Calcium: 10.1 mg/dL (ref 8.7–10.3)
Chloride: 104 mmol/L (ref 96–106)
Creatinine, Ser: 0.87 mg/dL (ref 0.57–1.00)
Globulin, Total: 2.5 g/dL (ref 1.5–4.5)
Glucose: 100 mg/dL — ABNORMAL HIGH (ref 65–99)
Potassium: 4.4 mmol/L (ref 3.5–5.2)
Sodium: 144 mmol/L (ref 134–144)
Total Protein: 6.9 g/dL (ref 6.0–8.5)
eGFR: 74 mL/min/{1.73_m2} (ref >59)

## 2021-01-22 LAB — CARDIOVASCULAR RISK ASSESSMENT

## 2021-01-22 LAB — LIPID PANEL
Chol/HDL Ratio: 2.1 ratio (ref 0.0–4.4)
Cholesterol, Total: 150 mg/dL (ref 100–199)
HDL: 70 mg/dL (ref >39)
LDL Chol Calc (NIH): 66 mg/dL (ref 0–99)
Triglycerides: 73 mg/dL (ref 0–149)
VLDL Cholesterol Cal: 14 mg/dL (ref 5–40)

## 2021-01-22 NOTE — Progress Notes (Signed)
Subjective: Lisa Montoya is a 65 y.o. female patient who returns to office for follow-up evaluation of right great toe pain.  Patient is assisted by caregiver reports that she is doing well no major concerns with pain to the right great toe but has been having difficulty walking and the open surgical shoe has had a few trips.  Patient is also here for nail care.  No other pedal complaints noted.  Patient Active Problem List   Diagnosis Date Noted  . Scabies 03/11/2020  . Falls frequently 02/25/2020  . BMI 40.0-44.9, adult (HCC) 02/25/2020  . Morbid obesity (HCC) 02/25/2020  . Bruise, trunk, initial encounter 02/25/2020  . Mixed hyperlipidemia 01/16/2020  . Elevated ferritin 01/16/2020  . Bilateral impacted cerumen 01/16/2020  . Intellectual disability 06/11/2019  . HTN (hypertension) 06/11/2019    Current Outpatient Medications on File Prior to Visit  Medication Sig Dispense Refill  . acetaminophen (TYLENOL) 500 MG tablet Take 1,000 mg by mouth every 6 (six) hours as needed for mild pain.    Marland Kitchen alum & mag hydroxide-simeth (ANTACID ANTI-GAS REG STRENGTH) 200-200-20 MG/5ML suspension Take 10 mLs by mouth every 4 (four) hours as needed for indigestion or heartburn.    Marland Kitchen aspirin EC 81 MG tablet Take 81 mg by mouth daily.    Marland Kitchen atorvastatin (LIPITOR) 10 MG tablet Take 1 tablet (10 mg total) by mouth daily. 90 tablet 1  . bacitracin-polymyxin b (POLYSPORIN) ophthalmic ointment Place 2 application into the left eye every 12 (twelve) hours. apply to eye every 12 hours while awake 3.5 g 0  . bisoprolol-hydrochlorothiazide (ZIAC) 10-6.25 MG tablet Take 0.5 tablets by mouth daily. 45 tablet 0  . carbamide peroxide (DEBROX) 6.5 % OTIC solution Place 5-10 drops into both ears 2 (two) times daily as needed (for removal of ear wax for ten days).    . chlorhexidine (PERIDEX) 0.12 % solution SWISH & SPIT 1/2 OZ BY MOUTH FOR 30 SECONDS TWICE A DAY **NO REFILLS** 473 mL 10  . clotrimazole (LOTRIMIN) 1 %  cream Apply 1 application topically daily as needed (ringworm, athlete's foot, jock/groin itch).    . Diethyltoluamide (OFF SMOOTH & DRY EX) Apply 1 application topically as needed (for insect repellant on outings).    . diphenhydrAMINE (BENADRYL) 25 MG tablet Take 25 mg by mouth 3 (three) times daily as needed for allergies.    . diphenhydrAMINE (SOMINEX) 25 MG tablet Take by mouth.    . hydrogen peroxide 3 % external solution Apply 1 application topically as needed (wound cleaning).    . Infant Care Products (DERMACLOUD) OINT Apply 1 Units topically 4 (four) times daily as needed. 430 g 1  . lip balm (BLISTEX) OINT Apply 1 application topically as needed for lip care.    . loperamide (IMODIUM A-D) 2 MG tablet Take 4 mg by mouth 4 (four) times daily as needed for diarrhea or loose stools.    . magnesium hydroxide (MILK OF MAGNESIA) 400 MG/5ML suspension Take 30 mLs by mouth daily as needed for mild constipation.    . Multiple Vitamin (MULTIVITAMIN) tablet Take 1 tablet by mouth daily.    Marland Kitchen neomycin-bacitracin-polymyxin (NEOSPORIN) [phone] ointment Apply 1 application topically 3 (three) times daily as needed (wound care or skin lesions).    . permethrin (ELIMITE) 5 % cream Apply from neck down - leave on 8-10 hours and then bathe after Also wash clothes and bedsheets 60 g 0  . Phenol-Phenolate Sodium (THROAT SPRAY MT) Use as directed 2  sprays in the mouth or throat as needed (five times daily for sore throat).    . polyvinyl alcohol (LIQUIFILM TEARS) 1.4 % ophthalmic solution Place 1-2 drops into both eyes as needed for dry eyes.    Marland Kitchen selenium sulfide (SELSUN) 1 % LOTN Apply 1 application topically as needed for irritation.    . sodium chloride (OCEAN) 0.65 % SOLN nasal spray Place 1-2 sprays into both nostrils as needed for congestion.    . Sunscreens (SUNBLOCK SPF30 EX) Apply 1 application topically as needed (to protect from sunburn on outings).    . timolol (BETIMOL) 0.25 % ophthalmic  solution Place 1-2 drops into both eyes 2 (two) times daily.    . timolol (TIMOPTIC) 0.25 % ophthalmic solution SMARTSIG:In Eye(s)     No current facility-administered medications on file prior to visit.    No Known Allergies  Objective:  General: Alert and oriented x2 in no acute distress however patient is noted to be whimpering during visit  Dermatology: No open lesions bilateral lower extremities, no webspace macerations, healed abrasion noted at the base of the right great toenail with no active bleeding or signs of infection.  Previous ecchymosis at the right great toe now resolved all nails x 10 are severely thickened and elongated consistent with mycosis.  Dry skin plantar surfaces of both feet.  Vascular: DP and PT pedal pulses faintly palpable, varicosities noted bilateral.  Previous edema to the right great toe now resolved.  Neurology: Unable to discern due to patient status.  Musculoskeletal: No obvious pain to palpation to the right great toe however patient is noted to be constantly moaning during the visit due to fear and mental/physical handicap.  X-rays reviewed that reveals a prematurely healed fracture at the distal tuft of the right great toe   Assessment and Plan: Problem List Items Addressed This Visit      Other   Intellectual disability    Other Visit Diagnoses    Contusion of right great toe without damage to nail, subsequent encounter    -  Primary   Relevant Orders   DG Foot Complete Right   Closed nondisplaced fracture of phalanx of right great toe with routine healing, unspecified phalanx, subsequent encounter       Toe pain, right       Pain due to onychomycosis of nail       PVD (peripheral vascular disease) (HCC)       Dry skin           -Complete examination performed -Xrays reviewed  -Discussed with caregiver that x-ray is prematurely healed may transition out of surgical shoe to normal shoe and may start physical therapy -Mechanically  debrided nails x10 using a sterile nail nipper without incident -Return to office as needed or sooner if problems or issues arise.  Asencion Islam, DPM

## 2021-01-22 NOTE — Patient Instructions (Signed)
Patient is cleared start PT May wear a normal shoe on the right since the great toe fracture is healed Return to office as needed

## 2021-01-29 ENCOUNTER — Other Ambulatory Visit: Payer: Self-pay | Admitting: Sports Medicine

## 2021-01-29 DIAGNOSIS — S90111D Contusion of right great toe without damage to nail, subsequent encounter: Secondary | ICD-10-CM

## 2021-04-21 ENCOUNTER — Ambulatory Visit: Payer: Medicare Other | Admitting: Family Medicine

## 2021-04-27 ENCOUNTER — Ambulatory Visit (INDEPENDENT_AMBULATORY_CARE_PROVIDER_SITE_OTHER): Payer: Medicare Other | Admitting: Family Medicine

## 2021-04-27 ENCOUNTER — Other Ambulatory Visit: Payer: Self-pay

## 2021-04-27 ENCOUNTER — Encounter: Payer: Self-pay | Admitting: Family Medicine

## 2021-04-27 VITALS — BP 130/70 | HR 84 | Temp 97.4°F | Resp 18 | Ht 61.0 in

## 2021-04-27 DIAGNOSIS — K8500 Idiopathic acute pancreatitis without necrosis or infection: Secondary | ICD-10-CM | POA: Diagnosis not present

## 2021-04-27 DIAGNOSIS — R10817 Generalized abdominal tenderness: Secondary | ICD-10-CM | POA: Diagnosis not present

## 2021-04-27 DIAGNOSIS — K851 Biliary acute pancreatitis without necrosis or infection: Secondary | ICD-10-CM

## 2021-04-27 DIAGNOSIS — K5909 Other constipation: Secondary | ICD-10-CM | POA: Diagnosis not present

## 2021-04-27 DIAGNOSIS — I700 Atherosclerosis of aorta: Secondary | ICD-10-CM

## 2021-04-27 LAB — COMPREHENSIVE METABOLIC PANEL
ALT: 26 IU/L (ref 0–32)
AST: 31 IU/L (ref 0–40)
Albumin/Globulin Ratio: 1.3 (ref 1.2–2.2)
Albumin: 3.3 g/dL — ABNORMAL LOW (ref 3.8–4.8)
Alkaline Phosphatase: 92 IU/L (ref 44–121)
BUN/Creatinine Ratio: 12 (ref 12–28)
BUN: 7 mg/dL — ABNORMAL LOW (ref 8–27)
Bilirubin Total: 0.3 mg/dL (ref 0.0–1.2)
CO2: 29 mmol/L (ref 20–29)
Calcium: 8.8 mg/dL (ref 8.7–10.3)
Chloride: 101 mmol/L (ref 96–106)
Creatinine, Ser: 0.58 mg/dL (ref 0.57–1.00)
Globulin, Total: 2.5 g/dL (ref 1.5–4.5)
Glucose: 101 mg/dL — ABNORMAL HIGH (ref 65–99)
Potassium: 4.4 mmol/L (ref 3.5–5.2)
Sodium: 140 mmol/L (ref 134–144)
Total Protein: 5.8 g/dL — ABNORMAL LOW (ref 6.0–8.5)
eGFR: 101 mL/min/{1.73_m2} (ref >59)

## 2021-04-27 LAB — CBC WITH DIFFERENTIAL/PLATELET
Basophils Absolute: 0 10*3/uL (ref 0.0–0.2)
Basos: 0 %
EOS (ABSOLUTE): 0.2 10*3/uL (ref 0.0–0.4)
Eos: 2 %
Hematocrit: 38.2 % (ref 34.0–46.6)
Hemoglobin: 12.8 g/dL (ref 11.1–15.9)
Lymphocytes Absolute: 1.7 10*3/uL (ref 0.7–3.1)
Lymphs: 14 %
MCH: 30.9 pg (ref 26.6–33.0)
MCHC: 33.5 g/dL (ref 31.5–35.7)
MCV: 92 fL (ref 79–97)
Monocytes Absolute: 0.6 10*3/uL (ref 0.1–0.9)
Monocytes: 5 %
Neutrophils Absolute: 9.6 10*3/uL — ABNORMAL HIGH (ref 1.4–7.0)
Neutrophils: 79 %
Platelets: 362 10*3/uL (ref 150–450)
RBC: 4.14 x10E6/uL (ref 3.77–5.28)
RDW: 13.5 % (ref 11.7–15.4)
WBC: 12.1 10*3/uL — ABNORMAL HIGH (ref 3.4–10.8)

## 2021-04-27 LAB — LIPASE: Lipase: 53 U/L (ref 14–72)

## 2021-04-27 LAB — AMYLASE: Amylase: 84 U/L (ref 31–110)

## 2021-04-27 MED ORDER — SACCHAROMYCES BOULARDII 250 MG PO CAPS: 250.0000 mg | ORAL_CAPSULE | Freq: Two times a day (BID) | ORAL | 11 refills | Status: DC

## 2021-04-27 NOTE — Progress Notes (Signed)
Delete.

## 2021-04-27 NOTE — Progress Notes (Addendum)
Subjective:  Patient ID: Lisa Montoya, female    DOB: [birth year]  Age: 65 y.o. MRN: [id number]  Chief Complaint  Patient presents with   Hospitalization Follow-up    Lisa Montoya is seen as a hospital follow-up for abdominal pain and a diagnosis of pancreatitis. She was admitted from 04/21/2021 to 04/25/2021. She is from [facility] and is accompanied by a caregiver. The pancreatitis was felt to be related to a gallstone which they felt must have passed. She was discharged on Ceftin and Florastor.  Amylase and lipase were normal. CT abdominal/pelvis: concerning for pancreatitis, also showed aortic atherosclerosis, mild ileus, and small right pleural effusion with atelectasis in the right lower lobe and middle rt lobe. WBC 16.4 decreased to 12.4 by discharge. Procalcitonin elevated on admission, but decreased by discharge. Hemoccult negative during admission. [place] of RUQ showed GB sludge. No stones.  She took Ibuprofen 800 mg for pain this morning but the caregiver reports it has not helped. She has had a very poor appetite and is not eating or drinking well. She had not had a BM since discharge. A GI consult was recommended at discharge but an appointment has not been made yet.   Current Outpatient Medications on File Prior to Visit  Medication Sig Dispense Refill   cefUROXime (CEFTIN) 500 MG tablet Take 500 mg by mouth 2 (two) times daily with a meal.     acetaminophen (TYLENOL) 500 MG tablet Take 1,000 mg by mouth every 6 (six) hours as needed for mild pain.     alum & mag hydroxide-simeth (ANTACID ANTI-GAS REG STRENGTH) 200-200-20 MG/5ML suspension Take 10 mLs by mouth every 4 (four) hours as needed for indigestion or heartburn.     aspirin EC 81 MG tablet Take 81 mg by mouth daily.     atorvastatin (LIPITOR) 10 MG tablet Take 1 tablet (10 mg total) by mouth daily. 90 tablet 1   bisoprolol-hydrochlorothiazide (ZIAC) 10-6.25 MG tablet Take 0.5 tablets by mouth daily. 45 tablet 0    carbamide peroxide (DEBROX) 6.5 % OTIC solution Place 5-10 drops into both ears 2 (two) times daily as needed (for removal of ear wax for ten days).     chlorhexidine (PERIDEX) 0.12 % solution SWISH & SPIT 1/2 OZ BY MOUTH FOR 30 SECONDS TWICE A DAY **NO REFILLS** 473 mL 10   clotrimazole (LOTRIMIN) 1 % cream Apply 1 application topically daily as needed (ringworm, athlete's foot, jock/groin itch).     Diethyltoluamide (OFF SMOOTH & DRY EX) Apply 1 application topically as needed (for insect repellant on outings).     diphenhydrAMINE (BENADRYL) 25 MG tablet Take 25 mg by mouth 3 (three) times daily as needed for allergies.     diphenhydrAMINE (SOMINEX) 25 MG tablet Take by mouth.     hydrogen peroxide 3 % external solution Apply 1 application topically as needed (wound cleaning).     Infant Care Products (DERMACLOUD) OINT Apply 1 Units topically 4 (four) times daily as needed. 430 g 1   lip balm (BLISTEX) OINT Apply 1 application topically as needed for lip care.     loperamide (IMODIUM A-D) 2 MG tablet Take 4 mg by mouth 4 (four) times daily as needed for diarrhea or loose stools.     magnesium hydroxide (MILK OF MAGNESIA) 400 MG/5ML suspension Take 30 mLs by mouth daily as needed for mild constipation.     Multiple Vitamin (MULTIVITAMIN) tablet Take 1 tablet by mouth daily.     Phenol-Phenolate  Sodium (THROAT SPRAY MT) Use as directed 2 sprays in the mouth or throat as needed (five times daily for sore throat).     polyvinyl alcohol (LIQUIFILM TEARS) 1.4 % ophthalmic solution Place 1-2 drops into both eyes as needed for dry eyes.     selenium sulfide (SELSUN) 1 % LOTN Apply 1 application topically as needed for irritation.     sodium chloride (OCEAN) 0.65 % SOLN nasal spray Place 1-2 sprays into both nostrils as needed for congestion.     Sunscreens (SUNBLOCK SPF30 EX) Apply 1 application topically as needed (to protect from sunburn on outings).     timolol (BETIMOL) 0.25 % ophthalmic solution  Place 1-2 drops into both eyes 2 (two) times daily.     timolol (TIMOPTIC) 0.25 % ophthalmic solution SMARTSIG:In Eye(s)     No current facility-administered medications on file prior to visit.   Past Medical History:  Diagnosis Date   Essential hypertension    Mental disability    Mixed hyperlipidemia    Morbid obesity due to excess calories (HCC)    Pneumonia due to SARS-associated coronavirus    History reviewed. No pertinent surgical history.  Family History  Family history unknown: Yes   Social History   Socioeconomic History   Marital status: Single    Spouse name: Not on file   Number of children: Not on file   Years of education: Not on file   Highest education level: Not on file  Occupational History   Not on file  Tobacco Use   Smoking status: Never   Smokeless tobacco: Never  Substance and Sexual Activity   Alcohol use: Never   Drug use: Never   Sexual activity: Not Currently  Other Topics Concern   Not on file  Social History Narrative   Not on file   Social Determinants of Health   Financial Resource Strain: Low Risk    Difficulty of Paying Living Expenses: Not hard at all  Food Insecurity: No Food Insecurity   Worried About Programme researcher, broadcasting/film/video in the Last Year: Never true   Ran Out of Food in the Last Year: Never true  Transportation Needs: Not on file  Physical Activity: Unknown   Days of Exercise per Week: 5 days   Minutes of Exercise per Session: Not on file  Stress: Not on file  Social Connections: Unknown   Frequency of Communication with Friends and Family: Not on file   Frequency of Social Gatherings with Friends and Family: Once a week   Attends Religious Services: Never   Database administrator or Organizations: No   Attends Banker Meetings: Never   Marital Status: Never married    Review of Systems  Constitutional:  Negative for chills and fever.  HENT:  Negative for congestion.   Respiratory:  Negative for shortness  of breath.   Cardiovascular:  Negative for chest pain.  Gastrointestinal:  Positive for abdominal pain and constipation. Negative for diarrhea, nausea and vomiting.    Objective:  BP 130/70   Pulse 84   Temp (!) 97.4 F (36.3 C)   Resp 18   Ht 5\' 1"  (1.549 m)   BMI 37.07 kg/m   BP/Weight 04/27/2021 01/21/2021 12/02/2020  Systolic BP 130 106 132  Diastolic BP 70 70 68  Wt. (Lbs) - 196.2 188  BMI 37.07 37.07 35.52    Physical Exam Vitals reviewed.  Constitutional:      Appearance: Normal appearance.  HENT:  Head: Normocephalic.  Cardiovascular:     Rate and Rhythm: Normal rate and regular rhythm.     Heart sounds: Normal heart sounds.  Pulmonary:     Effort: Pulmonary effort is normal.     Breath sounds: Normal breath sounds.  Abdominal:     General: Bowel sounds are normal. There is distension.     Palpations: Abdomen is soft.     Tenderness: There is abdominal tenderness (generalized.). There is no guarding or rebound.  Skin:    General: Skin is warm and dry.  Neurological:     Mental Status: She is alert.     Comments: Non verbal- will nod her head and moans     Lab Results  Component Value Date   WBC 12.1 (H) 04/27/2021   HGB 12.8 04/27/2021   HCT 38.2 04/27/2021   PLT 362 04/27/2021   GLUCOSE 101 (H) 04/27/2021   CHOL 150 01/21/2021   TRIG 73 01/21/2021   HDL 70 01/21/2021   LDLCALC 66 01/21/2021   ALT 26 04/27/2021   AST 31 04/27/2021   NA 140 04/27/2021   K 4.4 04/27/2021   CL 101 04/27/2021   CREATININE 0.58 04/27/2021   BUN 7 (L) 04/27/2021   CO2 29 04/27/2021   TSH 1.750 01/23/2020      Assessment & Plan:  1. Idiopathic acute pancreatitis, unspecified complication status Dxd while in hospital. Thought to be due to a passed gallstone. Enzymes never increased.  Repeat labs.  I had discussion with Dr. Edman Circle (GI) and anticipated ordering an MRCP if pt had not improved so much with BM that occurred. Caregivers to call if abdominal pain  recurs.  - Amylase - CBC with Differential/Platelet - Comprehensive metabolic panel - Lipase  2. Generalized abdominal tenderness without rebound tenderness Unclear if due to pancreatitis vs constipation.  Treat for constipation.  3. Other constipation Pt's caregiver instructed to give laxative on prn list which was MOM yesterday. Large BM occurred the morning after admission. Pt feeling much better.  Continue florastor.  4. Aortic atherosclerosis. Continue statin and aspirin.   Meds ordered this encounter  Medications   saccharomyces boulardii (FLORASTOR) 250 MG capsule    Sig: Take 1 capsule (250 mg total) by mouth 2 (two) times daily.    Dispense:  30 capsule    Refill:  11    Orders Placed This Encounter  Procedures   Amylase   CBC with Differential/Platelet   Comprehensive metabolic panel   Lipase     Follow-up: Return if symptoms worsen or fail to improve.  An After Visit Summary was printed and given to the patient. I attest that the above note was reviewed and is accurate. The patient was fully evaluated and examined by myself. I agree with plan.   Cyril Loosen, RN (NP student)  Blane Ohara, MD [facility] [phone]

## 2021-04-28 ENCOUNTER — Telehealth: Payer: Self-pay

## 2021-04-28 NOTE — Telephone Encounter (Addendum)
We called for follow-up of Lisa Montoya.  [name] reports that she seems to be feeling much better this morning.  She had a large bowel movement and she has been able to eat breakfast  She had a large bowl of cereal, milk and juice with no problems.  Dr. Sedalia Muta advised that they continue to monitor her bowels movements. Dr. Sedalia Muta is going to reach out to Dr. Chales Abrahams and continue with the follow-up to GI.  They were requested to call us back if she has increased pain, fever, chills or any other changes.   Dr Chales Abrahams: "I have looked at the chart under media tab. Her lipase was 195 and 158. Although CT did show pancreatitis. I am not convinced if she truly had pancreatitis. Her LFTs were all normal.   Given that she had good relief with laxatives. I think we should continue with that. Hold off on MRCP unless there is an obvious pancreatitis or increased LFTs or if she does not feel better"  Please ask [name] if she often has constipation and if so I would recommend miralax 17 gm twice a day.  Dr. Sedalia Muta

## 2021-05-11 ENCOUNTER — Ambulatory Visit (INDEPENDENT_AMBULATORY_CARE_PROVIDER_SITE_OTHER): Payer: Medicare Other | Admitting: Family Medicine

## 2021-05-11 ENCOUNTER — Other Ambulatory Visit: Payer: Self-pay

## 2021-05-11 VITALS — BP 132/80 | HR 81 | Temp 96.6°F | Resp 16 | Ht 63.0 in | Wt 190.0 lb

## 2021-05-11 DIAGNOSIS — R10817 Generalized abdominal tenderness: Secondary | ICD-10-CM

## 2021-05-11 DIAGNOSIS — K5909 Other constipation: Secondary | ICD-10-CM | POA: Diagnosis not present

## 2021-05-11 NOTE — Progress Notes (Signed)
Subjective:  Patient ID: Lisa Montoya, female    DOB: [birth year]  Age: 65 y.o. MRN: [id number]  Chief Complaint  Patient presents with   2 week follow up    HPI Caregiver-Audry who came with patient today states that patient has improved since 2 weeks ago. Patient no longer shows signs of abdominal discomfort, is eating, drinking and willing to get out of bed. Edison Simon states patient needs a work note stating she can return to work. Appetite is better.   Current Outpatient Medications on File Prior to Visit  Medication Sig Dispense Refill   acetaminophen (TYLENOL) 500 MG tablet Take 1,000 mg by mouth every 6 (six) hours as needed for mild pain.     alum & mag hydroxide-simeth (ANTACID ANTI-GAS REG STRENGTH) 200-200-20 MG/5ML suspension Take 10 mLs by mouth every 4 (four) hours as needed for indigestion or heartburn.     aspirin EC 81 MG tablet Take 81 mg by mouth daily.     atorvastatin (LIPITOR) 10 MG tablet Take 1 tablet (10 mg total) by mouth daily. 90 tablet 1   bisoprolol-hydrochlorothiazide (ZIAC) 10-6.25 MG tablet Take 0.5 tablets by mouth daily. 45 tablet 0   carbamide peroxide (DEBROX) 6.5 % OTIC solution Place 5-10 drops into both ears 2 (two) times daily as needed (for removal of ear wax for ten days).     chlorhexidine (PERIDEX) 0.12 % solution SWISH & SPIT 1/2 OZ BY MOUTH FOR 30 SECONDS TWICE A DAY **NO REFILLS** 473 mL 10   clotrimazole (LOTRIMIN) 1 % cream Apply 1 application topically daily as needed (ringworm, athlete's foot, jock/groin itch).     Diethyltoluamide (OFF SMOOTH & DRY EX) Apply 1 application topically as needed (for insect repellant on outings).     diphenhydrAMINE (BENADRYL) 25 MG tablet Take 25 mg by mouth 3 (three) times daily as needed for allergies.     diphenhydrAMINE (SOMINEX) 25 MG tablet Take by mouth.     hydrogen peroxide 3 % external solution Apply 1 application topically as needed (wound cleaning).     Infant Care Products (DERMACLOUD) OINT  Apply 1 Units topically 4 (four) times daily as needed. 430 g 1   lip balm (BLISTEX) OINT Apply 1 application topically as needed for lip care.     loperamide (IMODIUM A-D) 2 MG tablet Take 4 mg by mouth 4 (four) times daily as needed for diarrhea or loose stools.     magnesium hydroxide (MILK OF MAGNESIA) 400 MG/5ML suspension Take 30 mLs by mouth daily as needed for mild constipation.     Multiple Vitamin (MULTIVITAMIN) tablet Take 1 tablet by mouth daily.     Phenol-Phenolate Sodium (THROAT SPRAY MT) Use as directed 2 sprays in the mouth or throat as needed (five times daily for sore throat).     polyvinyl alcohol (LIQUIFILM TEARS) 1.4 % ophthalmic solution Place 1-2 drops into both eyes as needed for dry eyes.     saccharomyces boulardii (FLORASTOR) 250 MG capsule Take 1 capsule (250 mg total) by mouth 2 (two) times daily. 30 capsule 11   selenium sulfide (SELSUN) 1 % LOTN Apply 1 application topically as needed for irritation.     sodium chloride (OCEAN) 0.65 % SOLN nasal spray Place 1-2 sprays into both nostrils as needed for congestion.     Sunscreens (SUNBLOCK SPF30 EX) Apply 1 application topically as needed (to protect from sunburn on outings).     timolol (BETIMOL) 0.25 % ophthalmic solution Place 1-2 drops into  both eyes 2 (two) times daily.     timolol (TIMOPTIC) 0.25 % ophthalmic solution SMARTSIG:In Eye(s)     No current facility-administered medications on file prior to visit.   Past Medical History:  Diagnosis Date   Essential hypertension    Mental disability    Mixed hyperlipidemia    Morbid obesity due to excess calories (HCC)    Pneumonia due to SARS-associated coronavirus    History reviewed. No pertinent surgical history.  Family History  Family history unknown: Yes   Social History   Socioeconomic History   Marital status: Single    Spouse name: Not on file   Number of children: Not on file   Years of education: Not on file   Highest education level: Not on  file  Occupational History   Not on file  Tobacco Use   Smoking status: Never   Smokeless tobacco: Never  Substance and Sexual Activity   Alcohol use: Never   Drug use: Never   Sexual activity: Not Currently  Other Topics Concern   Not on file  Social History Narrative   Not on file   Social Determinants of Health   Financial Resource Strain: Low Risk    Difficulty of Paying Living Expenses: Not hard at all  Food Insecurity: No Food Insecurity   Worried About Programme researcher, broadcasting/film/video in the Last Year: Never true   Ran Out of Food in the Last Year: Never true  Transportation Needs: Not on file  Physical Activity: Unknown   Days of Exercise per Week: 5 days   Minutes of Exercise per Session: Not on file  Stress: Not on file  Social Connections: Unknown   Frequency of Communication with Friends and Family: Not on file   Frequency of Social Gatherings with Friends and Family: Once a week   Attends Religious Services: Never   Database administrator or Organizations: No   Attends Banker Meetings: Never   Marital Status: Never married    Review of Systems  Constitutional:  Negative for chills, fatigue and fever.  HENT:  Negative for congestion, ear pain, rhinorrhea and sore throat.   Respiratory:  Negative for cough and shortness of breath.   Cardiovascular:  Negative for chest pain.  Gastrointestinal:  Negative for abdominal pain, constipation, diarrhea, nausea and vomiting.    Objective:  BP 132/80   Pulse 81   Temp (!) 96.6 F (35.9 C)   Resp 16   Ht 5\' 3"  (1.6 m)   Wt 190 lb (86.2 kg)   SpO2 100%   BMI 33.66 kg/m   BP/Weight 05/11/2021 04/27/2021 01/21/2021  Systolic BP 132 130 106  Diastolic BP 80 70 70  Wt. (Lbs) 190 - 196.2  BMI 33.66 37.07 37.07    Physical Exam Vitals reviewed.  Constitutional:      Appearance: Normal appearance. She is obese.  Neck:     Vascular: No carotid bruit.  Cardiovascular:     Rate and Rhythm: Normal rate and  regular rhythm.     Heart sounds: Normal heart sounds.  Pulmonary:     Effort: Pulmonary effort is normal. No respiratory distress.     Breath sounds: Normal breath sounds.  Abdominal:     General: Abdomen is flat. Bowel sounds are normal.     Palpations: Abdomen is soft.     Tenderness: There is no abdominal tenderness.  Neurological:     Mental Status: She is alert.  Psychiatric:  Mood and Affect: Mood normal.        Behavior: Behavior normal.    Diabetic Foot Exam - Simple   No data filed      Lab Results  Component Value Date   WBC 12.1 (H) 04/27/2021   HGB 12.8 04/27/2021   HCT 38.2 04/27/2021   PLT 362 04/27/2021   GLUCOSE 101 (H) 04/27/2021   CHOL 150 01/21/2021   TRIG 73 01/21/2021   HDL 70 01/21/2021   LDLCALC 66 01/21/2021   ALT 26 04/27/2021   AST 31 04/27/2021   NA 140 04/27/2021   K 4.4 04/27/2021   CL 101 04/27/2021   CREATININE 0.58 04/27/2021   BUN 7 (L) 04/27/2021   CO2 29 04/27/2021   TSH 1.750 01/23/2020      Assessment & Plan:   1. Other constipation Resolved. 2. Generalized abdominal tenderness without rebound tenderness  Resolved.   Follow-up: Return in about 4 months (around 09/10/2021) for chronic fasting.  An After Visit Summary was printed and given to the patient.  Blane Ohara, MD [facility] [phone]

## 2021-05-26 ENCOUNTER — Encounter: Payer: Self-pay | Admitting: Family Medicine

## 2021-05-26 DIAGNOSIS — R10817 Generalized abdominal tenderness: Secondary | ICD-10-CM | POA: Insufficient documentation

## 2021-05-26 DIAGNOSIS — K5909 Other constipation: Secondary | ICD-10-CM | POA: Insufficient documentation

## 2021-06-01 ENCOUNTER — Telehealth: Payer: Self-pay

## 2021-06-01 ENCOUNTER — Encounter: Payer: Self-pay | Admitting: Family Medicine

## 2021-06-01 NOTE — Telephone Encounter (Signed)
Caregiver called and stated that patient needs an order for a gait belt and transfer shower chair. Psychologist is requesting this for patient Lisa Montoya can be reached at [phone]

## 2021-06-01 NOTE — Telephone Encounter (Signed)
Order written

## 2021-06-09 ENCOUNTER — Other Ambulatory Visit: Payer: Self-pay | Admitting: Family Medicine

## 2021-06-15 ENCOUNTER — Encounter: Payer: Self-pay | Admitting: Gastroenterology

## 2021-06-30 ENCOUNTER — Ambulatory Visit (INDEPENDENT_AMBULATORY_CARE_PROVIDER_SITE_OTHER): Payer: Medicare Other

## 2021-06-30 ENCOUNTER — Other Ambulatory Visit: Payer: Self-pay

## 2021-06-30 DIAGNOSIS — Z23 Encounter for immunization: Secondary | ICD-10-CM

## 2021-06-30 NOTE — Progress Notes (Signed)
   Covid-[facility] Vaccination [facility]  Name:  Lisa Montoya    MRN: [id number] DOB: 06/29/1956  06/30/2021  [name] was observed post Covid-19 immunization for 15 minutes without incident. She was provided with Vaccine Information Sheet and instruction to access the V-Safe system.   [name] was instructed to call 911 with any severe reactions post vaccine: Difficulty breathing  Swelling of face and throat  A fast heartbeat  A bad rash all over body  Dizziness and weakness   Immunizations Administered     Name Date Dose VIS Date Route   Moderna Covid-19 vaccine Bivalent Booster 06/30/2021 10:03 AM 0.5 mL 04/17/2021 Intramuscular   Manufacturer: Moderna   Lot: [id number]   NDC: 81103-159-45

## 2021-07-13 ENCOUNTER — Telehealth: Payer: Self-pay

## 2021-07-13 NOTE — Telephone Encounter (Signed)
error 

## 2021-08-22 ENCOUNTER — Other Ambulatory Visit: Payer: Self-pay | Admitting: Family Medicine

## 2021-09-13 NOTE — Progress Notes (Signed)
Subjective:  Patient ID: Lisa Montoya, female    DOB: 06-May-1956  Age: 66 y.o. MRN: [id number]  Chief Complaint  Patient presents with   Hypertension   Hyperlipidemia   HPI: Hyperlipidemia: Current Medications: Atorvastatin 10mg  1 tablet daily. Patient is doing activity and doing job every day.  Hypertension: Current Medications: Bisoprolol/HCTZ 10/6.25mg  take 1/2 tablet once daily.  Mentally disabled: Patient mentioned to sleep good, and denied any pain.    Current Outpatient Medications on File Prior to Visit  Medication Sig Dispense Refill   acetaminophen (TYLENOL) 500 MG tablet Take 1,000 mg by mouth every 6 (six) hours as needed for mild pain.     alum & mag hydroxide-simeth (MAALOX/MYLANTA) 200-200-20 MG/5ML suspension Take 10 mLs by mouth every 4 (four) hours as needed for indigestion or heartburn.     aspirin EC 81 MG tablet Take 81 mg by mouth daily.     atorvastatin (LIPITOR) 10 MG tablet TAKE 1 TABLET BY MOUTH EVERY DAY 30 tablet 11   bisoprolol-hydrochlorothiazide (ZIAC) 10-6.25 MG tablet TAKE 1/2 TABLET BY MOUTH EVERY DAY 15 tablet 11   carbamide peroxide (DEBROX) 6.5 % OTIC solution Place 5-10 drops into both ears 2 (two) times daily as needed (for removal of ear wax for ten days).     chlorhexidine (PERIDEX) 0.12 % solution SWISH & SPIT 1/2 OZ BY MOUTH FOR 30 SECONDS TWICE A DAY 473 mL 11   clotrimazole (LOTRIMIN) 1 % cream Apply 1 application topically daily as needed (ringworm, athlete's foot, jock/groin itch).     Diethyltoluamide (OFF SMOOTH & DRY EX) Apply 1 application topically as needed (for insect repellant on outings).     diphenhydrAMINE (BENADRYL) 25 MG tablet Take 25 mg by mouth 3 (three) times daily as needed for allergies.     diphenhydrAMINE (SOMINEX) 25 MG tablet Take by mouth.     hydrogen peroxide 3 % external solution Apply 1 application topically as needed (wound cleaning).     Infant Care Products (DERMACLOUD) OINT Apply 1 Units topically 4  (four) times daily as needed. 430 g 1   lip balm (BLISTEX) OINT Apply 1 application topically as needed for lip care.     loperamide (IMODIUM A-D) 2 MG tablet Take 4 mg by mouth 4 (four) times daily as needed for diarrhea or loose stools.     magnesium hydroxide (MILK OF MAGNESIA) 400 MG/5ML suspension Take 30 mLs by mouth daily as needed for mild constipation.     Multiple Vitamin (MULTIVITAMIN) tablet Take 1 tablet by mouth daily.     Phenol-Phenolate Sodium (THROAT SPRAY MT) Use as directed 2 sprays in the mouth or throat as needed (five times daily for sore throat).     polyvinyl alcohol (LIQUIFILM TEARS) 1.4 % ophthalmic solution Place 1-2 drops into both eyes as needed for dry eyes.     saccharomyces boulardii (FLORASTOR) 250 MG capsule Take 1 capsule (250 mg total) by mouth 2 (two) times daily. 30 capsule 11   selenium sulfide (SELSUN) 1 % LOTN Apply 1 application topically as needed for irritation.     sodium chloride (OCEAN) 0.65 % SOLN nasal spray Place 1-2 sprays into both nostrils as needed for congestion.     Sunscreens (SUNBLOCK A999333 EX) Apply 1 application topically as needed (to protect from sunburn on outings).     timolol (BETIMOL) 0.25 % ophthalmic solution Place 1-2 drops into both eyes 2 (two) times daily.     timolol (TIMOPTIC) 0.25 % ophthalmic  solution SMARTSIG:In Eye(s)     No current facility-administered medications on file prior to visit.   Past Medical History:  Diagnosis Date   Essential hypertension    Mental disability    Mixed hyperlipidemia    Morbid obesity due to excess calories ([place])    Pneumonia due to SARS-associated coronavirus    History reviewed. No pertinent surgical history.  Family History  Family history unknown: Yes   Social History   Socioeconomic History   Marital status: Single    Spouse name: Not on file   Number of children: Not on file   Years of education: Not on file   Highest education level: Not on file  Occupational  History   Not on file  Tobacco Use   Smoking status: Never   Smokeless tobacco: Never  Substance and Sexual Activity   Alcohol use: Never   Drug use: Never   Sexual activity: Not Currently  Other Topics Concern   Not on file  Social History Narrative   Not on file   Social Determinants of Health   Financial Resource Strain: Low Risk    Difficulty of Paying Living Expenses: Not hard at all  Food Insecurity: No Food Insecurity   Worried About Charity fundraiser in the Last Year: Never true   [place] in the Last Year: Never true  Transportation Needs: Not on file  Physical Activity: Unknown   Days of Exercise per Week: 5 days   Minutes of Exercise per Session: Not on file  Stress: Not on file  Social Connections: Unknown   Frequency of Communication with Friends and Family: Not on file   Frequency of Social Gatherings with Friends and Family: Once a week   Attends Religious Services: Never   Marine scientist or Organizations: No   Attends Archivist Meetings: Never   Marital Status: Never married    Review of Systems  Constitutional:  Negative for appetite change, fatigue and fever.  HENT:  Negative for congestion, ear pain, sinus pressure and sore throat.   Eyes:  Negative for pain.  Respiratory:  Negative for cough, chest tightness, shortness of breath and wheezing.   Cardiovascular:  Negative for chest pain and palpitations.  Gastrointestinal:  Negative for abdominal pain, constipation, diarrhea, nausea and vomiting.  Genitourinary:  Negative for dysuria and hematuria.  Musculoskeletal:  Negative for arthralgias, back pain, joint swelling and myalgias.  Skin:  Negative for rash.  Neurological:  Negative for dizziness, weakness and headaches.  Psychiatric/Behavioral:  Negative for dysphoric mood. The patient is not nervous/anxious.     Objective:  BP 138/82 (BP Location: Right Arm, Patient Position: Sitting)    Pulse 76    Temp 97.8 F (36.6  C) (Temporal)    Ht 5\' 3"  (1.6 m)    Wt 194 lb (88 kg)    SpO2 99%    BMI 34.37 kg/m   BP/Weight 09/14/2021 05/11/2021 [id number]  Systolic BP 0000000 [id number]  Diastolic BP 82 80 70  Wt. (Lbs) 194 190 -  BMI 34.37 33.66 37.07    Physical Exam Vitals reviewed.  Constitutional:      Appearance: Normal appearance. She is obese.  Neck:     Vascular: No carotid bruit.  Cardiovascular:     Rate and Rhythm: Normal rate and regular rhythm.     Pulses: Normal pulses.     Heart sounds: Normal heart sounds.  Pulmonary:     Effort: Pulmonary  effort is normal. No respiratory distress.     Breath sounds: Normal breath sounds.  Abdominal:     General: Abdomen is flat. Bowel sounds are normal. There is distension.     Palpations: Abdomen is soft.     Tenderness: There is no abdominal tenderness.  Neurological:     Mental Status: She is alert and oriented to person, place, and time.  Psychiatric:        Behavior: Behavior normal.     Comments: Patient do not express verbal normally    Diabetic Foot Exam - Simple   No data filed      Lab Results  Component Value Date   WBC 12.1 (H) 04/27/2021   HGB 12.8 04/27/2021   HCT 38.2 04/27/2021   PLT 362 04/27/2021   GLUCOSE 101 (H) 04/27/2021   CHOL 150 01/21/2021   TRIG 73 01/21/2021   HDL 70 01/21/2021   LDLCALC 66 01/21/2021   ALT 26 04/27/2021   AST 31 04/27/2021   NA 140 04/27/2021   K 4.4 04/27/2021   CL 101 04/27/2021   CREATININE 0.58 04/27/2021   BUN 7 (L) 04/27/2021   CO2 29 04/27/2021   TSH 1.750 01/23/2020      Assessment & Plan:   Problem List Items Addressed This Visit       Cardiovascular and Mediastinum   Essential hypertension - Primary    Well controlled.  No changes to medicines.  Continue to work on eating a healthy diet and exercise.          Other   Mixed hyperlipidemia    Well controlled.  No changes to medicines.  Continue to work on eating a healthy diet and exercise.        Morbid obesity  ([place])    Recommend continue to work on eating healthy diet and exercise.       Intellectual disability    Lives in group home. Stable.      Kristian Covey Arienne Gartin,acting as a scribe for Rochel Brome, MD.,have documented all relevant documentation on the behalf of Rochel Brome, MD,as directed by  Rochel Brome, MD while in the presence of Rochel Brome, MD.    Follow-up: Return in about 3 months (around 12/13/2021).  An After Visit Summary was printed and given to the patient.      Rochel Brome, MD [facility] [phone]

## 2021-09-14 ENCOUNTER — Ambulatory Visit (INDEPENDENT_AMBULATORY_CARE_PROVIDER_SITE_OTHER): Payer: Medicare Other | Admitting: Family Medicine

## 2021-09-14 ENCOUNTER — Other Ambulatory Visit: Payer: Self-pay

## 2021-09-14 ENCOUNTER — Encounter: Payer: Self-pay | Admitting: Family Medicine

## 2021-09-14 VITALS — BP 138/82 | HR 76 | Temp 97.8°F | Ht 63.0 in | Wt 194.0 lb

## 2021-09-14 DIAGNOSIS — I10 Essential (primary) hypertension: Secondary | ICD-10-CM

## 2021-09-14 DIAGNOSIS — F79 Unspecified intellectual disabilities: Secondary | ICD-10-CM

## 2021-09-14 DIAGNOSIS — E782 Mixed hyperlipidemia: Secondary | ICD-10-CM

## 2021-09-14 NOTE — Assessment & Plan Note (Addendum)
Lives in group home. Stable.

## 2021-09-14 NOTE — Assessment & Plan Note (Addendum)
Well controlled. No changes to medicines.  Continue to work on eating a healthy diet and exercise.    

## 2021-09-15 NOTE — Assessment & Plan Note (Signed)
Recommend continue to work on eating healthy diet and exercise.  

## 2021-11-02 ENCOUNTER — Encounter: Payer: Self-pay | Admitting: Sports Medicine

## 2021-11-02 ENCOUNTER — Other Ambulatory Visit: Payer: Self-pay

## 2021-11-02 ENCOUNTER — Ambulatory Visit (INDEPENDENT_AMBULATORY_CARE_PROVIDER_SITE_OTHER): Payer: Medicare Other | Admitting: Sports Medicine

## 2021-11-02 DIAGNOSIS — I739 Peripheral vascular disease, unspecified: Secondary | ICD-10-CM

## 2021-11-02 DIAGNOSIS — L608 Other nail disorders: Secondary | ICD-10-CM | POA: Diagnosis not present

## 2021-11-02 DIAGNOSIS — M79674 Pain in right toe(s): Secondary | ICD-10-CM | POA: Diagnosis not present

## 2021-11-02 MED ORDER — LIDOCAINE 5 % EX OINT: 1.0000 "application " | TOPICAL_OINTMENT | CUTANEOUS | 0 refills | Status: DC | PRN

## 2021-11-02 NOTE — Progress Notes (Signed)
Subjective: Lisa Montoya is a 66 y.o. female patient seen today in office pain at right second toenail patient is assisted by caregiver from [place] who reports that she does not know how this started she came back off vacation and noticed that there was some dark discoloration to the right second toenail and patient complaining of pain.  Patient Active Problem List   Diagnosis Date Noted   Other constipation 05/26/2021   Falls frequently 02/25/2020   BMI 40.0-44.9, adult (HCC) 02/25/2020   Morbid obesity (HCC) 02/25/2020   Mixed hyperlipidemia 01/16/2020   Elevated ferritin 01/16/2020   Intellectual disability 06/11/2019   Essential hypertension 06/11/2019    Current Outpatient Medications on File Prior to Visit  Medication Sig Dispense Refill   acetaminophen (TYLENOL) 500 MG tablet Take 1,000 mg by mouth every 6 (six) hours as needed for mild pain.     alum & mag hydroxide-simeth (MAALOX/MYLANTA) 200-200-20 MG/5ML suspension Take 10 mLs by mouth every 4 (four) hours as needed for indigestion or heartburn.     aspirin EC 81 MG tablet Take 81 mg by mouth daily.     atorvastatin (LIPITOR) 10 MG tablet TAKE 1 TABLET BY MOUTH EVERY DAY 30 tablet 11   bisoprolol-hydrochlorothiazide (ZIAC) 10-6.25 MG tablet TAKE 1/2 TABLET BY MOUTH EVERY DAY 15 tablet 11   carbamide peroxide (DEBROX) 6.5 % OTIC solution Place 5-10 drops into both ears 2 (two) times daily as needed (for removal of ear wax for ten days).     chlorhexidine (PERIDEX) 0.12 % solution SWISH & SPIT 1/2 OZ BY MOUTH FOR 30 SECONDS TWICE A DAY 473 mL 11   clotrimazole (LOTRIMIN) 1 % cream Apply 1 application topically daily as needed (ringworm, athlete's foot, jock/groin itch).     Diethyltoluamide (OFF SMOOTH & DRY EX) Apply 1 application topically as needed (for insect repellant on outings).     diphenhydrAMINE (BENADRYL) 25 MG tablet Take 25 mg by mouth 3 (three) times daily as needed for allergies.     diphenhydrAMINE (SOMINEX)  25 MG tablet Take by mouth.     hydrogen peroxide 3 % external solution Apply 1 application topically as needed (wound cleaning).     Infant Care Products (DERMACLOUD) OINT Apply 1 Units topically 4 (four) times daily as needed. 430 g 1   lip balm (BLISTEX) OINT Apply 1 application topically as needed for lip care.     loperamide (IMODIUM A-D) 2 MG tablet Take 4 mg by mouth 4 (four) times daily as needed for diarrhea or loose stools.     magnesium hydroxide (MILK OF MAGNESIA) 400 MG/5ML suspension Take 30 mLs by mouth daily as needed for mild constipation.     Multiple Vitamin (MULTIVITAMIN) tablet Take 1 tablet by mouth daily.     Phenol-Phenolate Sodium (THROAT SPRAY MT) Use as directed 2 sprays in the mouth or throat as needed (five times daily for sore throat).     polyvinyl alcohol (LIQUIFILM TEARS) 1.4 % ophthalmic solution Place 1-2 drops into both eyes as needed for dry eyes.     saccharomyces boulardii (FLORASTOR) 250 MG capsule Take 1 capsule (250 mg total) by mouth 2 (two) times daily. 30 capsule 11   selenium sulfide (SELSUN) 1 % LOTN Apply 1 application topically as needed for irritation.     sodium chloride (OCEAN) 0.65 % SOLN nasal spray Place 1-2 sprays into both nostrils as needed for congestion.     Sunscreens (SUNBLOCK SPF30 EX) Apply 1 application topically as  needed (to protect from sunburn on outings).     timolol (BETIMOL) 0.25 % ophthalmic solution Place 1-2 drops into both eyes 2 (two) times daily.     timolol (TIMOPTIC) 0.25 % ophthalmic solution SMARTSIG:In Eye(s)     No current facility-administered medications on file prior to visit.    No Known Allergies  Objective: Physical Exam  General: Well developed, nourished, no acute distress, awake, alert and oriented x 2 patient has an adult mental handicap and is assisted by caregiver  Vascular: Dorsalis pedis artery 1/4 bilateral, Posterior tibial artery 0/4 bilateral, skin temperature warm to to cool proximal to  distal bilateral lower extremities, purple discoloration to feet with mild  varicosities, no pedal hair present bilateral.  Neurological: Difficult to discern due to patient mental status  Dermatological: All toenails are short and thick there is dried blood noted to the right second toenail the nail appears to be well attached with no significant erythema or edema no warmth no active drainage.  Musculoskeletal: Mild guarding due to pain at the affected area on the right second toe.  Patient is noted to be whimpering during the visit because she is afraid of pain.  Assessment and Plan:  Problem List Items Addressed This Visit   None Visit Diagnoses     Hemorrhage of nail    -  Primary   Toe pain, right       PVD (peripheral vascular disease) (HCC)           -Examined patient.  -Discussed treatment options for dried blood at right second toenail -Applied lidocaine and protective Band-Aid and advised caregiver to do the same during the day but remove Band-Aid at night until the toenail has improved; Rx topical lidocaine sent to patient pharmacy -Advised the caregiver that the blood will slowly grow out for the nail may fall off when it is ready however at this time due to poor circulation I do not recommend aggressively trimming or removing the nail because the patient may not be able to heal it -Return to office in 4 to 6 weeks for toe check or sooner if problems or issues arise  Asencion Islam, DPM

## 2021-11-11 ENCOUNTER — Ambulatory Visit (INDEPENDENT_AMBULATORY_CARE_PROVIDER_SITE_OTHER): Payer: Medicare Other | Admitting: Nurse Practitioner

## 2021-11-11 ENCOUNTER — Encounter: Payer: Self-pay | Admitting: Nurse Practitioner

## 2021-11-11 ENCOUNTER — Other Ambulatory Visit: Payer: Self-pay

## 2021-11-11 VITALS — BP 110/64 | HR 87 | Temp 97.7°F | Ht 63.0 in | Wt 189.2 lb

## 2021-11-11 DIAGNOSIS — S7012XA Contusion of left thigh, initial encounter: Secondary | ICD-10-CM | POA: Diagnosis not present

## 2021-11-11 DIAGNOSIS — Z7982 Long term (current) use of aspirin: Secondary | ICD-10-CM

## 2021-11-11 DIAGNOSIS — R58 Hemorrhage, not elsewhere classified: Secondary | ICD-10-CM | POA: Diagnosis not present

## 2021-11-11 NOTE — Progress Notes (Signed)
Acute Office Visit  Subjective:    Patient ID: Lisa Montoya, female    DOB: February 23, 1956, 66 y.o.   MRN: [id number]  Chief Complaint  Patient presents with   Leg bruise    HPI: Lisa Montoya is a 66 year old Caucasian female that presents for evaluation of left posterior thigh bruise. She has intellectual disabilities with aphasia. She is a resident of a local group home, accompanied by group home staff member. Staff member states a group home employee reported a small bruise to left posterior thigh yesterday. No history of recent falls or injury. Staff member states Lisa Montoya "flops down into chair" when moving to a seated position. Pt takes Aspirin 81 mg daily. No limping, grimacing, or guarding behavior noted by group staff member.    Past Medical History:  Diagnosis Date   Essential hypertension    Mental disability    Mixed hyperlipidemia    Morbid obesity due to excess calories ([place])    Pneumonia due to SARS-associated coronavirus     History reviewed. No pertinent surgical history.  Family History  Family history unknown: Yes    Social History   Socioeconomic History   Marital status: Single    Spouse name: Not on file   Number of children: Not on file   Years of education: Not on file   Highest education level: Not on file  Occupational History   Not on file  Tobacco Use   Smoking status: Never   Smokeless tobacco: Never  Substance and Sexual Activity   Alcohol use: Never   Drug use: Never   Sexual activity: Not Currently  Other Topics Concern   Not on file  Social History Narrative   Not on file   Social Determinants of Health   Financial Resource Strain: Low Risk    Difficulty of Paying Living Expenses: Not hard at all  Food Insecurity: No Food Insecurity   Worried About Charity fundraiser in the Last Year: Never true   [place] in the Last Year: Never true  Transportation Needs: Not on file  Physical Activity: Unknown   Days of Exercise per  Week: 5 days   Minutes of Exercise per Session: Not on file  Stress: Not on file  Social Connections: Unknown   Frequency of Communication with Friends and Family: Not on file   Frequency of Social Gatherings with Friends and Family: Once a week   Attends Religious Services: Never   Marine scientist or Organizations: No   Attends Music therapist: Never   Marital Status: Never married  Human resources officer Violence: Not At Risk   Fear of Current or Ex-Partner: No   Emotionally Abused: No   Physically Abused: No   Sexually Abused: No    Outpatient Medications Prior to Visit  Medication Sig Dispense Refill   acetaminophen (TYLENOL) 500 MG tablet Take 1,000 mg by mouth every 6 (six) hours as needed for mild pain.     alum & mag hydroxide-simeth (MAALOX/MYLANTA) 200-200-20 MG/5ML suspension Take 10 mLs by mouth every 4 (four) hours as needed for indigestion or heartburn.     aspirin EC 81 MG tablet Take 81 mg by mouth daily.     atorvastatin (LIPITOR) 10 MG tablet TAKE 1 TABLET BY MOUTH EVERY DAY 30 tablet 11   bisoprolol-hydrochlorothiazide (ZIAC) 10-6.25 MG tablet TAKE 1/2 TABLET BY MOUTH EVERY DAY 15 tablet 11   carbamide peroxide (DEBROX) 6.5 % OTIC solution Place 5-10 drops  into both ears 2 (two) times daily as needed (for removal of ear wax for ten days).     chlorhexidine (PERIDEX) 0.12 % solution SWISH & SPIT 1/2 OZ BY MOUTH FOR 30 SECONDS TWICE A DAY 473 mL 11   clotrimazole (LOTRIMIN) 1 % cream Apply 1 application topically daily as needed (ringworm, athlete's foot, jock/groin itch).     Diethyltoluamide (OFF SMOOTH & DRY EX) Apply 1 application topically as needed (for insect repellant on outings).     diphenhydrAMINE (BENADRYL) 25 MG tablet Take 25 mg by mouth 3 (three) times daily as needed for allergies.     diphenhydrAMINE (SOMINEX) 25 MG tablet Take by mouth.     hydrogen peroxide 3 % external solution Apply 1 application topically as needed (wound cleaning).      Infant Care Products (DERMACLOUD) OINT Apply 1 Units topically 4 (four) times daily as needed. 430 g 1   lidocaine (XYLOCAINE) 5 % ointment Apply 1 application topically as needed for mild pain. At right 2nd toe and cover with bandaid during the day 35.44 g 0   lip balm (BLISTEX) OINT Apply 1 application topically as needed for lip care.     loperamide (IMODIUM A-D) 2 MG tablet Take 4 mg by mouth 4 (four) times daily as needed for diarrhea or loose stools.     magnesium hydroxide (MILK OF MAGNESIA) 400 MG/5ML suspension Take 30 mLs by mouth daily as needed for mild constipation.     Multiple Vitamin (MULTIVITAMIN) tablet Take 1 tablet by mouth daily.     Phenol-Phenolate Sodium (THROAT SPRAY MT) Use as directed 2 sprays in the mouth or throat as needed (five times daily for sore throat).     polyvinyl alcohol (LIQUIFILM TEARS) 1.4 % ophthalmic solution Place 1-2 drops into both eyes as needed for dry eyes.     saccharomyces boulardii (FLORASTOR) 250 MG capsule Take 1 capsule (250 mg total) by mouth 2 (two) times daily. 30 capsule 11   selenium sulfide (SELSUN) 1 % LOTN Apply 1 application topically as needed for irritation.     sodium chloride (OCEAN) 0.65 % SOLN nasal spray Place 1-2 sprays into both nostrils as needed for congestion.     Sunscreens (SUNBLOCK FFM38 EX) Apply 1 application topically as needed (to protect from sunburn on outings).     timolol (BETIMOL) 0.25 % ophthalmic solution Place 1-2 drops into both eyes 2 (two) times daily.     timolol (TIMOPTIC) 0.25 % ophthalmic solution SMARTSIG:In Eye(s)     No facility-administered medications prior to visit.    No Known Allergies  Review of Systems  Unable to perform ROS: Patient nonverbal      Objective:    Physical Exam Vitals reviewed.  Constitutional:      Appearance: She is obese.  Cardiovascular:     Rate and Rhythm: Normal rate and regular rhythm.     Pulses: Normal pulses.     Heart sounds: Normal heart sounds.   Pulmonary:     Effort: Pulmonary effort is normal.     Breath sounds: Normal breath sounds.  Skin:    Capillary Refill: Capillary refill takes less than 2 seconds.     Findings: Bruising (posterior left thigh) present.          Comments: Circular shaped healing bruise noted to posterior thigh approximately 2 mm in diameter; no facial grimacing with palpation  Neurological:     Mental Status: She is alert. Mental status is at baseline.  Ht 5' 3"  (1.6 m)    Wt 189 lb 3.2 oz (85.8 kg)    BMI 33.52 kg/m  Wt Readings from Last 3 Encounters:  11/11/21 189 lb 3.2 oz (85.8 kg)  09/14/21 194 lb (88 kg)  05/11/21 190 lb (86.2 kg)    Health Maintenance Due  Topic Date Due   Hepatitis C Screening  Never done   Zoster Vaccines- Shingrix (1 of 2) Never done   COLONOSCOPY (Pts 45-64yr Insurance coverage will need to be confirmed)  04/12/2021   MAMMOGRAM  07/10/2021       Lab Results  Component Value Date   TSH 1.750 01/23/2020   Lab Results  Component Value Date   WBC 12.1 (H) 04/27/2021   HGB 12.8 04/27/2021   HCT 38.2 04/27/2021   MCV 92 04/27/2021   PLT 362 04/27/2021   Lab Results  Component Value Date   NA 140 04/27/2021   K 4.4 04/27/2021   CO2 29 04/27/2021   GLUCOSE 101 (H) 04/27/2021   BUN 7 (L) 04/27/2021   CREATININE 0.58 04/27/2021   BILITOT 0.3 04/27/2021   ALKPHOS 92 04/27/2021   AST 31 04/27/2021   ALT 26 04/27/2021   PROT 5.8 (L) 04/27/2021   ALBUMIN 3.3 (L) 04/27/2021   CALCIUM 8.8 04/27/2021   ANIONGAP 10 06/16/2019   EGFR 101 04/27/2021   Lab Results  Component Value Date   CHOL 150 01/21/2021   Lab Results  Component Value Date   HDL 70 01/21/2021   Lab Results  Component Value Date   LDLCALC 66 01/21/2021   Lab Results  Component Value Date   TRIG 73 01/21/2021   Lab Results  Component Value Date   CHOLHDL 2.1 01/21/2021        Assessment & Plan:   1. Ecchymosis  2. Contusion of left thigh, initial encounter  3.  Aspirin long-term use   Change positions slowly Avoid "falling" into chairs when sitting if possible      Follow-up: PRN  An After Visit Summary was printed and given to the patient.  I, SRip Harbour NP, have reviewed all documentation for this visit. The documentation on 11/11/21 for the exam, diagnosis, procedures, and orders are all accurate and complete.    Signed, SRip Harbour NP [place]([phone]

## 2021-11-11 NOTE — Patient Instructions (Addendum)
Change positions slowly ?Avoid "falling" into chairs when sitting if possible ? ? ?Contusion ?A contusion is a deep bruise. This is a result of an injury that causes bleeding under the skin. Symptoms of bruising include pain, swelling, and discolored skin. The skin may turn blue, purple, or yellow. ?Follow these instructions at home: ?Managing pain, stiffness, and swelling ?You may use RICE. This stands for: ?Resting. ?Icing. ?Compression, or putting pressure. ?Elevating, or raising the injured area. ?To follow this method, do these actions: ?Rest the injured area. ?If told, put ice on the injured area. ?Put ice in a plastic bag. ?Place a towel between your skin and the bag. ?Leave the ice on for 20 minutes, 2-3 times per day. ?If told, put light pressure (compression) on the injured area using an elastic bandage. Make sure the bandage is not too tight. If the area tingles or becomes numb, remove it and put it back on as told by your doctor. ?If possible, raise (elevate) the injured area above the level of your heart while you are sitting or lying down. ? ?General instructions ?Take over-the-counter and prescription medicines only as told by your doctor. ?Keep all follow-up visits as told by your doctor. This is important. ?Contact a doctor if: ?Your symptoms do not get better after several days of treatment. ?Your symptoms get worse. ?You have trouble moving the injured area. ?Get help right away if: ?You have very bad pain. ?You have a loss of feeling (numbness) in a hand or foot. ?Your hand or foot turns pale or cold. ?Summary ?A contusion is a deep bruise. This is a result of an injury that causes bleeding under the skin. ?Symptoms of bruising include pain, swelling, and discolored skin. The skin may turn blue, purple, or yellow. ?This condition is treated with rest, ice, compression, and elevation. This is also called RICE. You may be given over-the-counter medicines for pain. ?Contact a doctor if you do not  feel better, or you feel worse. Get help right away if you have very bad pain, have lost feeling in a hand or foot, or the area turns pale or cold. ?This information is not intended to replace advice given to you by your health care provider. Make sure you discuss any questions you have with your health care provider. ?Document Revised: 04/13/2018 Document Reviewed: 04/13/2018 ?Elsevier Patient Education ? 2022 Elsevier Inc. ? ?

## 2021-11-22 ENCOUNTER — Other Ambulatory Visit: Payer: Self-pay

## 2021-11-22 ENCOUNTER — Encounter: Payer: Self-pay | Admitting: Nurse Practitioner

## 2021-11-22 ENCOUNTER — Ambulatory Visit (INDEPENDENT_AMBULATORY_CARE_PROVIDER_SITE_OTHER): Payer: Medicare Other | Admitting: Nurse Practitioner

## 2021-11-22 VITALS — BP 112/68 | HR 59 | Temp 97.2°F | Resp 16 | Ht 63.0 in | Wt 200.0 lb

## 2021-11-22 DIAGNOSIS — Z1211 Encounter for screening for malignant neoplasm of colon: Secondary | ICD-10-CM | POA: Diagnosis not present

## 2021-11-22 DIAGNOSIS — Z1231 Encounter for screening mammogram for malignant neoplasm of breast: Secondary | ICD-10-CM

## 2021-11-22 DIAGNOSIS — Z13820 Encounter for screening for osteoporosis: Secondary | ICD-10-CM

## 2021-11-22 DIAGNOSIS — Z780 Asymptomatic menopausal state: Secondary | ICD-10-CM

## 2021-11-22 DIAGNOSIS — Z0000 Encounter for general adult medical examination without abnormal findings: Secondary | ICD-10-CM

## 2021-11-22 DIAGNOSIS — Z6835 Body mass index (BMI) 35.0-35.9, adult: Secondary | ICD-10-CM

## 2021-11-22 NOTE — Patient Instructions (Addendum)
Continue medications  ?Patient to return for pneumonia vaccines ?Patient needs to obtain Shingles vaccines at local pharmacies ?We will call you with mammogram appointment on Wednesday, April 26th, 2023 ?Follow-up on May 25th, 2023 at 9:00 ? ?Health Maintenance After Age 65 ?After age 65, you are at a higher risk for certain long-term diseases and infections as well as injuries from falls. Falls are a major cause of broken bones and head injuries in people who are older than age 65. Getting regular preventive care can help to keep you healthy and well. Preventive care includes getting regular testing and making lifestyle changes as recommended by your health care provider. Talk with your health care provider about: ?Which screenings and tests you should have. A screening is a test that checks for a disease when you have no symptoms. ?A diet and exercise plan that is right for you. ?What should I know about screenings and tests to prevent falls? ?Screening and testing are the best ways to find a health problem early. Early diagnosis and treatment give you the best chance of managing medical conditions that are common after age 65. Certain conditions and lifestyle choices may make you more likely to have a fall. Your health care provider may recommend: ?Regular vision checks. Poor vision and conditions such as cataracts can make you more likely to have a fall. If you wear glasses, make sure to get your prescription updated if your vision changes. ?Medicine review. Work with your health care provider to regularly review all of the medicines you are taking, including over-the-counter medicines. Ask your health care provider about any side effects that may make you more likely to have a fall. Tell your health care provider if any medicines that you take make you feel dizzy or sleepy. ?Strength and balance checks. Your health care provider may recommend certain tests to check your strength and balance while standing,  walking, or changing positions. ?Foot health exam. Foot pain and numbness, as well as not wearing proper footwear, can make you more likely to have a fall. ?Screenings, including: ?Osteoporosis screening. Osteoporosis is a condition that causes the bones to get weaker and break more easily. ?Blood pressure screening. Blood pressure changes and medicines to control blood pressure can make you feel dizzy. ?Depression screening. You may be more likely to have a fall if you have a fear of falling, feel depressed, or feel unable to do activities that you used to do. ?Alcohol use screening. Using too much alcohol can affect your balance and may make you more likely to have a fall. ?Follow these instructions at home: ?Lifestyle ?Do not drink alcohol if: ?Your health care provider tells you not to drink. ?If you drink alcohol: ?Limit how much you have to: ?0-1 drink a day for women. ?0-2 drinks a day for men. ?Know how much alcohol is in your drink. In the [place] one drink equals one 12 oz bottle of beer (355 mL), one 5 oz glass of wine (148 mL), or one 1? oz glass of hard liquor (44 mL). ?Do not use any products that contain nicotine or tobacco. These products include cigarettes, chewing tobacco, and vaping devices, such as e-cigarettes. If you need help quitting, ask your health care provider. ?Activity ? ?Follow a regular exercise program to stay fit. This will help you maintain your balance. Ask your health care provider what types of exercise are appropriate for you. ?If you need a cane or walker, use it as recommended by your health care  provider. ?Wear supportive shoes that have nonskid soles. ?Safety ? ?Remove any tripping hazards, such as rugs, cords, and clutter. ?Install safety equipment such as grab bars in bathrooms and safety rails on stairs. ?Keep rooms and walkways well-lit. ?General instructions ?Talk with your health care provider about your risks for falling. Tell your health care provider if: ?You fall.  Be sure to tell your health care provider about all falls, even ones that seem minor. ?You feel dizzy, tiredness (fatigue), or off-balance. ?Take over-the-counter and prescription medicines only as told by your health care provider. These include supplements. ?Eat a healthy diet and maintain a healthy weight. A healthy diet includes low-fat dairy products, low-fat (lean) meats, and fiber from whole grains, beans, and lots of fruits and vegetables. ?Stay current with your vaccines. ?Schedule regular health, dental, and eye exams. ?Summary ?Having a healthy lifestyle and getting preventive care can help to protect your health and wellness after age 37. ?Screening and testing are the best way to find a health problem early and help you avoid having a fall. Early diagnosis and treatment give you the best chance for managing medical conditions that are more common for people who are older than age 53. ?Falls are a major cause of broken bones and head injuries in people who are older than age 4. Take precautions to prevent a fall at home. ?Work with your health care provider to learn what changes you can make to improve your health and wellness and to prevent falls. ?This information is not intended to replace advice given to you by your health care provider. Make sure you discuss any questions you have with your health care provider. ?Document Revised: 01/11/2021 Document Reviewed: 01/11/2021 ?Elsevier Patient Education ? 2022 Elsevier Inc. ? ?Follow-up as scheduled ? ?Pneumococcal Conjugate Vaccine (Prevnar 20) Suspension for Injection ?What is this medication? ?PNEUMOCOCCAL VACCINE (NEU mo KOK al vak SEEN) is a vaccine. It prevents pneumococcus bacterial infections. These bacteria can cause serious infections like pneumonia, meningitis, and blood infections. This vaccine will not treat an infection and will not cause infection. This vaccine is recommended for adults 18 years and older. ?This medicine may be used for  other purposes; ask your health care provider or pharmacist if you have questions. ?COMMON BRAND NAME(S): Prevnar 20 ?What should I tell my care team before I take this medication? ?They need to know if you have any of these conditions: ?bleeding disorder ?fever ?immune system problems ?an unusual or allergic reaction to pneumococcal vaccine, diphtheria toxoid, other vaccines, other medicines, foods, dyes, or preservatives ?pregnant or trying to get pregnant ?breast-feeding ?How should I use this medication? ?This vaccine is injected into a muscle. It is given by a health care provider. ?A copy of Vaccine Information Statements will be given before each vaccination. Be sure to read this information carefully each time. This sheet may change often. ?Talk to your health care provider about the use of this medicine in children. Special care may be needed. ?Overdosage: If you think you have taken too much of this medicine contact a poison control center or emergency room at once. ?NOTE: This medicine is only for you. Do not share this medicine with others. ?What if I miss a dose? ?This does not apply. This medicine is not for regular use. ?What may interact with this medication? ?medicines for cancer chemotherapy ?medicines that suppress your immune function ?steroid medicines like prednisone or cortisone ?This list may not describe all possible interactions. Give your health care provider  a list of all the medicines, herbs, non-prescription drugs, or dietary supplements you use. Also tell them if you smoke, drink alcohol, or use illegal drugs. Some items may interact with your medicine. ?What should I watch for while using this medication? ?Mild fever and pain should go away in 3 days or less. Report any unusual symptoms to your health care provider. ?What side effects may I notice from receiving this medication? ?Side effects that you should report to your doctor or health care professional as soon as  possible: ?allergic reactions (skin rash, itching or hives; swelling of the face, lips, or tongue) ?confusion ?fast, irregular heartbeat ?fever over 102 degrees F ?muscle weakness ?seizures ?trouble breathing ?unusual brui

## 2021-11-22 NOTE — Progress Notes (Signed)
? ?Subjective:  ? Lisa Montoya is a 66 y.o. female who presents for Medicare Annual (Subsequent) preventive examination. ? ?HPI: ?Lisa Montoya is 66 year old Caucasian female that presents for Medicare annual wellness visit. 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 Domingo Pulse, a Medical sales representative member is present. Pt is aphasic.   ?   ?Objective:  ? Lisa Montoya is a 66 year old Caucasian female that presents for Medicare annual wellness ?Today's Vitals  ? 11/22/21 1357 11/22/21 1407  ?Pulse: (!) 59   ?Temp: (!) 97.2 ?F (36.2 ?C)   ?SpO2: 100%   ?Weight: 200 lb (90.7 kg)   ?Height: 5\' 3"  (1.6 m)   ?PainSc:  0-No pain  ? ?Body mass index is 35.43 kg/m?. ? ?Advanced Directives 11/22/2021 06/14/2019  ?Does Patient Have a Medical Advance Directive? Yes Unable to assess, patient is non-responsive or altered mental status  ?Type of Advance Directive (No Data) -  ? ? ?Current Medications (verified) ?Outpatient Encounter Medications as of 11/22/2021  ?Medication Sig  ? acetaminophen (TYLENOL) 500 MG tablet Take 1,000 mg by mouth every 6 (six) hours as needed for mild pain.  ? alum & mag hydroxide-simeth (MAALOX/MYLANTA) 200-200-20 MG/5ML suspension Take 10 mLs by mouth every 4 (four) hours as needed for indigestion or heartburn.  ? aspirin EC 81 MG tablet Take 81 mg by mouth daily.  ? atorvastatin (LIPITOR) 10 MG tablet TAKE 1 TABLET BY MOUTH EVERY DAY  ? bisoprolol-hydrochlorothiazide (ZIAC) 10-6.25 MG tablet TAKE 1/2 TABLET BY MOUTH EVERY DAY  ? carbamide peroxide (DEBROX) 6.5 % OTIC solution Place 5-10 drops into both ears 2 (two) times daily as needed (for removal of ear wax for ten days).  ? chlorhexidine (PERIDEX) 0.12 % solution SWISH & SPIT 1/2 OZ BY MOUTH FOR 30 SECONDS TWICE A DAY  ? clotrimazole (LOTRIMIN) 1 % cream Apply 1 application topically daily as needed (ringworm, athlete's foot, jock/groin itch).  ? Diethyltoluamide (OFF SMOOTH & DRY EX) Apply 1 application topically as needed (for insect repellant on outings).  ?  diphenhydrAMINE (BENADRYL) 25 MG tablet Take 25 mg by mouth 3 (three) times daily as needed for allergies.  ? diphenhydrAMINE (SOMINEX) 25 MG tablet Take by mouth.  ? hydrogen peroxide 3 % external solution Apply 1 application topically as needed (wound cleaning).  ? Infant Care Products (DERMACLOUD) OINT Apply 1 Units topically 4 (four) times daily as needed.  ? lidocaine (XYLOCAINE) 5 % ointment Apply 1 application topically as needed for mild pain. At right 2nd toe and cover with bandaid during the day  ? lip balm (BLISTEX) OINT Apply 1 application topically as needed for lip care.  ? loperamide (IMODIUM A-D) 2 MG tablet Take 4 mg by mouth 4 (four) times daily as needed for diarrhea or loose stools.  ? magnesium hydroxide (MILK OF MAGNESIA) 400 MG/5ML suspension Take 30 mLs by mouth daily as needed for mild constipation.  ? Multiple Vitamin (MULTIVITAMIN) tablet Take 1 tablet by mouth daily.  ? Phenol-Phenolate Sodium (THROAT SPRAY MT) Use as directed 2 sprays in the mouth or throat as needed (five times daily for sore throat).  ? polyvinyl alcohol (LIQUIFILM TEARS) 1.4 % ophthalmic solution Place 1-2 drops into both eyes as needed for dry eyes.  ? saccharomyces boulardii (FLORASTOR) 250 MG capsule Take 1 capsule (250 mg total) by mouth 2 (two) times daily.  ? selenium sulfide (SELSUN) 1 % LOTN Apply 1 application topically as needed for irritation.  ? sodium chloride (OCEAN) 0.65 % SOLN nasal spray  Place 1-2 sprays into both nostrils as needed for congestion.  ? Sunscreens (SUNBLOCK SPF30 EX) Apply 1 application topically as needed (to protect from sunburn on outings).  ? timolol (BETIMOL) 0.25 % ophthalmic solution Place 1-2 drops into both eyes 2 (two) times daily.  ? timolol (TIMOPTIC) 0.25 % ophthalmic solution SMARTSIG:In Eye(s)  ? ?No facility-administered encounter medications on file as of 11/22/2021.  ? ? ?Allergies (verified) ?Patient has no known allergies.  ? ?History: ?Past Medical History:   ?Diagnosis Date  ? Essential hypertension   ? Mental disability   ? Mixed hyperlipidemia   ? Morbid obesity due to excess calories (HCC)   ? Pneumonia due to SARS-associated coronavirus   ? ?History reviewed. No pertinent surgical history. ?Family History  ?Family history unknown: Yes  ? ?Social History  ? ?Socioeconomic History  ? Marital status: Single  ?  Spouse name: Not on file  ? Number of children: Not on file  ? Years of education: Not on file  ? Highest education level: Not on file  ?Occupational History  ? Not on file  ?Tobacco Use  ? Smoking status: Never  ? Smokeless tobacco: Never  ?Substance and Sexual Activity  ? Alcohol use: Never  ? Drug use: Never  ? Sexual activity: Not Currently  ?Other Topics Concern  ? Not on file  ?Social History Narrative  ? Not on file  ? ?Social Determinants of Health  ? ?Financial Resource Strain: Low Risk   ? Difficulty of Paying Living Expenses: Not hard at all  ?Food Insecurity: No Food Insecurity  ? Worried About Programme researcher, broadcasting/film/videounning Out of Food in the Last Year: Never true  ? Ran Out of Food in the Last Year: Never true  ?Transportation Needs: No Transportation Needs  ? Lack of Transportation (Medical): No  ? Lack of Transportation (Non-Medical): No  ?Physical Activity: Inactive  ? Days of Exercise per Week: 0 days  ? Minutes of Exercise per Session: 0 min  ?Stress: No Stress Concern Present  ? Feeling of Stress : Not at all  ?Social Connections: Socially Isolated  ? Frequency of Communication with Friends and Family: Never  ? Frequency of Social Gatherings with Friends and Family: Once a week  ? Attends Religious Services: Never  ? Active Member of Clubs or Organizations: No  ? Attends BankerClub or Organization Meetings: Never  ? Marital Status: Never married  ? ? ?Tobacco Counseling ?Counseling given: Not Answered ? ? ?Clinical Intake: ? ?Pre-visit preparation completed: No ? ?Pain : No/denies pain ?Pain Score: 0-No pain ? ?  ?Nutritional Status: BMI > 30  Obese ?Nutritional Risks:  None ?Diabetes: No ? ?How often do you need to have someone help you when you read instructions, pamphlets, or other written materials from your doctor or pharmacy?: Pt has mental disabilities and is aphasic ? ?Diabetic: no ? ?Interpreter Needed?: No ? ?  ?Activities of Daily Living ?In your present state of health, do you have any difficulty performing the following activities: 11/22/2021  ?Hearing? N  ?Vision? N  ?Difficulty concentrating or making decisions? Y  ?Walking or climbing stairs? N  ?Dressing or bathing? Y  ?Doing errands, shopping? Y  ?Preparing Food and eating ? Y  ?Using the Toilet? Y  ?In the past six months, have you accidently leaked urine? Y  ?Do you have problems with loss of bowel control? Y  ?Managing your Medications? Y  ?Managing your Finances? Y  ?Housekeeping or managing your Housekeeping? Y  ?Some recent  data might be hidden  ? ? ?Patient Care Team: ?CoxFritzi Mandes, MD as PCP - General (Family Medicine) ? ? ?   ?Assessment:  ? This is a routine wellness examination for Lisa Montoya. ? ?Hearing/Vision screen ?Past due for eye exam ? ?Dietary issues and exercise activities discussed: ?Current Exercise Habits: The patient does not participate in regular exercise at present, Exercise limited by: neurologic condition(s) ? ? Goals Addressed   ? ?  ?Depression Screen ?PHQ 2/9 Scores 11/22/2021 11/11/2021 11/19/2020  ?PHQ - 2 Score 0 0 0  ?  ?Fall Risk ?Fall Risk  11/22/2021 11/11/2021 05/11/2021 01/21/2021 11/19/2020  ?Falls in the past year? 0 0 0 1 0  ?Number falls in past yr: 0 0 0 1 0  ?Injury with Fall? 0 0 0 1 0  ?Risk for fall due to : No Fall Risks - No Fall Risks History of fall(s);Impaired balance/gait;Impaired mobility History of fall(s);Impaired balance/gait  ?Follow up Falls evaluation completed - Falls evaluation completed - Falls prevention discussed  ? ? ?FALL RISK PREVENTION PERTAINING TO THE HOME: ? ?Any stairs in or around the home? Yes  ?If so, are there any without handrails? Yes  ?Home free  of loose throw rugs in walkways, pet beds, electrical cords, etc? Yes  ?Adequate lighting in your home to reduce risk of falls? Yes  ? ?ASSISTIVE DEVICES UTILIZED TO PREVENT FALLS: ? ?Life alert? No  ?Use of a

## 2021-11-30 ENCOUNTER — Ambulatory Visit: Payer: Medicare Other | Admitting: Sports Medicine

## 2021-12-01 ENCOUNTER — Telehealth: Payer: Self-pay | Admitting: Family Medicine

## 2021-12-01 NOTE — Telephone Encounter (Signed)
? ?  Lisa Montoya has been scheduled for the following appointment: ? ?WHAT: BONE DENSITY ?WHERE: [place] ?DATE: 12/23/21 ?TIME: 2:30 PM ARRIVAL TIME ? ?A message has been left for the patient. ?  ?

## 2021-12-02 ENCOUNTER — Other Ambulatory Visit: Payer: Self-pay | Admitting: Nurse Practitioner

## 2021-12-02 DIAGNOSIS — Z1231 Encounter for screening mammogram for malignant neoplasm of breast: Secondary | ICD-10-CM

## 2021-12-16 ENCOUNTER — Ambulatory Visit: Payer: Medicare Other | Admitting: Family Medicine

## 2021-12-20 LAB — COLOGUARD

## 2021-12-24 ENCOUNTER — Ambulatory Visit (INDEPENDENT_AMBULATORY_CARE_PROVIDER_SITE_OTHER): Payer: Medicare Other | Admitting: Sports Medicine

## 2021-12-24 ENCOUNTER — Encounter: Payer: Self-pay | Admitting: Sports Medicine

## 2021-12-24 DIAGNOSIS — L84 Corns and callosities: Secondary | ICD-10-CM

## 2021-12-24 DIAGNOSIS — I739 Peripheral vascular disease, unspecified: Secondary | ICD-10-CM

## 2021-12-24 DIAGNOSIS — B351 Tinea unguium: Secondary | ICD-10-CM | POA: Diagnosis not present

## 2021-12-24 DIAGNOSIS — M79609 Pain in unspecified limb: Secondary | ICD-10-CM | POA: Diagnosis not present

## 2021-12-24 DIAGNOSIS — F79 Unspecified intellectual disabilities: Secondary | ICD-10-CM

## 2021-12-24 NOTE — Progress Notes (Signed)
Subjective: ?Lisa Montoya is a 66 y.o. female patient seen today in office with complaint of mildly painful thickened and elongated toenails and callus; unable to trim. Patient is assisted by facility aide who denies any changes with medical history since last encounter and reports that her right second toe is better.  ? ?Patient Active Problem List  ? Diagnosis Date Noted  ? Other constipation 05/26/2021  ? Falls frequently 02/25/2020  ? BMI 40.0-44.9, adult (HCC) 02/25/2020  ? Morbid obesity (HCC) 02/25/2020  ? Mixed hyperlipidemia 01/16/2020  ? Elevated ferritin 01/16/2020  ? Intellectual disability 06/11/2019  ? Essential hypertension 06/11/2019  ? ? ?Current Outpatient Medications on File Prior to Visit  ?Medication Sig Dispense Refill  ? aspirin EC 81 MG tablet Take 81 mg by mouth daily.    ? atorvastatin (LIPITOR) 10 MG tablet TAKE 1 TABLET BY MOUTH EVERY DAY 30 tablet 11  ? bisoprolol-hydrochlorothiazide (ZIAC) 10-6.25 MG tablet TAKE 1/2 TABLET BY MOUTH EVERY DAY 15 tablet 11  ? chlorhexidine (PERIDEX) 0.12 % solution SWISH & SPIT 1/2 OZ BY MOUTH FOR 30 SECONDS TWICE A DAY 473 mL 11  ? ?No current facility-administered medications on file prior to visit.  ? ? ?No Known Allergies ? ?Objective: ?Physical Exam ? ?General: Well developed, nourished, no acute distress, mental status is difficult to discern due to patient's intellectual disability ? ?Vascular: Dorsalis pedis artery 1/4 bilateral, Posterior tibial artery 1/4 bilateral, skin temperature warm to warm proximal to distal bilateral lower extremities, minimal varicosities, minimal pedal hair present bilateral. ? ?Neurological: Gross sensation present via light touch bilateral.  ? ?Dermatological: Skin is warm, dry, and supple bilateral, Nails 1-10 are tender, long, thick, and discolored with mild subungal debris, no webspace macerations present bilateral, callus/hyperkeratotic tissue present submet 1 on right>left.  No signs of infection  bilateral. ? ?Musculoskeletal:Asymptomatic fat pad atrophy and prominent first metatarsal head boney deformities noted bilateral. Muscular strength within normal limits without painon range of motion. No pain with calf compression bilateral. ? ?Assessment and Plan:  ?Problem List Items Addressed This Visit   ? ?  ? Other  ? Intellectual disability  ? ?Other Visit Diagnoses   ? ? Pain due to onychomycosis of nail    -  Primary  ? Callus      ? PVD (peripheral vascular disease) (HCC)      ? ?  ? ? ? ?-Examined patient.  ?-Re-Discussed treatment options for painful mycotic nails and callus. ?-Mechanically debrided and reduced all painful mycotic nails with sterile nail nipper and dremel nail file without incident. ?-Smoothed callus using a rotary bur without incident bilateral ?-Recommend good supportive shoes daily for foot type and dispensed dancers pads for patient to use to further help with offloading to first metatarsal head since her insurance will not cover orthotics ?-Patient to return in 3 months for follow-up nail and callus care or sooner if symptoms worsen. ? ?Asencion Islam, DPM ? ?

## 2021-12-29 ENCOUNTER — Ambulatory Visit
Admission: RE | Admit: 2021-12-29 | Discharge: 2021-12-29 | Disposition: A | Discharge status: Home / Self Care | Payer: Medicare Other | Source: Ambulatory Visit | Attending: Nurse Practitioner | Admitting: Nurse Practitioner

## 2021-12-29 DIAGNOSIS — Z1231 Encounter for screening mammogram for malignant neoplasm of breast: Secondary | ICD-10-CM

## 2022-01-04 ENCOUNTER — Other Ambulatory Visit: Payer: Self-pay

## 2022-01-05 MED ORDER — BISOPROLOL-HYDROCHLOROTHIAZIDE 10-6.25 MG PO TABS
0.5000 | ORAL_TABLET | Freq: Every day | ORAL | 11 refills | Status: DC
Start: 2022-01-05 — End: 2023-01-09

## 2022-01-05 MED ORDER — ATORVASTATIN CALCIUM 10 MG PO TABS: 10.0000 mg | ORAL_TABLET | Freq: Every day | ORAL | 11 refills | Status: DC

## 2022-01-26 ENCOUNTER — Ambulatory Visit (INDEPENDENT_AMBULATORY_CARE_PROVIDER_SITE_OTHER): Payer: Medicare Other | Admitting: Family Medicine

## 2022-01-26 ENCOUNTER — Encounter: Payer: Self-pay | Admitting: Family Medicine

## 2022-01-26 VITALS — BP 134/78 | HR 78 | Temp 96.9°F | Resp 18 | Ht 63.0 in | Wt 203.0 lb

## 2022-01-26 DIAGNOSIS — I10 Essential (primary) hypertension: Secondary | ICD-10-CM | POA: Diagnosis not present

## 2022-01-26 DIAGNOSIS — Z23 Encounter for immunization: Secondary | ICD-10-CM

## 2022-01-26 DIAGNOSIS — F79 Unspecified intellectual disabilities: Secondary | ICD-10-CM

## 2022-01-26 DIAGNOSIS — E782 Mixed hyperlipidemia: Secondary | ICD-10-CM

## 2022-01-26 NOTE — Progress Notes (Unsigned)
Subjective:  Patient ID: Lisa Montoya, female    DOB: April 10, 1956  Age: 66 y.o. MRN: [id number]  Chief Complaint  Patient presents with   Hypertension   Edema   Hyperlipidemia   HPI: Patient is a 66 year old intellectually disabled, minimally verbal white female who presents for chronic follow-up of hyperlipidemia, hypertension, and morbid obesity.  She lives in a group home.  She is here with one of the caretakers from the home.  She does not exercise.  The home tries to provide a fairly healthy diet.  Hypertension: She is taking Bisoprolol-Hctz  10-6.25 mg 1/2 tablet daily, aspirin 81 mg daily.  Hyperlipidemia: Taking Atorvastatin 10 mg daily.  Current Outpatient Medications on File Prior to Visit  Medication Sig Dispense Refill   aspirin EC 81 MG tablet Take 81 mg by mouth daily.     atorvastatin (LIPITOR) 10 MG tablet Take 1 tablet (10 mg total) by mouth daily. 30 tablet 11   bisoprolol-hydrochlorothiazide (ZIAC) 10-6.25 MG tablet Take 0.5 tablets by mouth daily. 15 tablet 11   chlorhexidine (PERIDEX) 0.12 % solution SWISH & SPIT 1/2 OZ BY MOUTH FOR 30 SECONDS TWICE A DAY 473 mL 11   No current facility-administered medications on file prior to visit.   Past Medical History:  Diagnosis Date   Essential hypertension    Mental disability    Mixed hyperlipidemia    Morbid obesity due to excess calories (HCC)    Pneumonia due to SARS-associated coronavirus    History reviewed. No pertinent surgical history.  Family History  Problem Relation Age of Onset   Breast cancer Neg Hx    Social History   Socioeconomic History   Marital status: Single    Spouse name: Not on file   Number of children: Not on file   Years of education: Not on file   Highest education level: Not on file  Occupational History   Not on file  Tobacco Use   Smoking status: Never   Smokeless tobacco: Never  Substance and Sexual Activity   Alcohol use: Never   Drug use: Never   Sexual  activity: Not Currently  Other Topics Concern   Not on file  Social History Narrative   Not on file   Social Determinants of Health   Financial Resource Strain: Low Risk    Difficulty of Paying Living Expenses: Not hard at all  Food Insecurity: No Food Insecurity   Worried About Running Out of Food in the Last Year: Never true   Ran Out of Food in the Last Year: Never true  Transportation Needs: No Transportation Needs   Lack of Transportation (Medical): No   Lack of Transportation (Non-Medical): No  Physical Activity: Inactive   Days of Exercise per Week: 0 days   Minutes of Exercise per Session: 0 min  Stress: No Stress Concern Present   Feeling of Stress : Not at all  Social Connections: Socially Isolated   Frequency of Communication with Friends and Family: Never   Frequency of Social Gatherings with Friends and Family: Once a week   Attends Religious Services: Never   Database administrator or Organizations: No   Attends Banker Meetings: Never   Marital Status: Never married    Review of Systems  Constitutional:  Negative for chills, fatigue and fever.  HENT:  Negative for congestion, rhinorrhea and sore throat.   Respiratory:  Negative for cough and shortness of breath.   Cardiovascular:  Positive for leg  swelling. Negative for chest pain.  Gastrointestinal:  Negative for abdominal pain, constipation, diarrhea, nausea and vomiting.  Genitourinary:  Negative for dysuria and urgency.  Musculoskeletal:  Negative for back pain and myalgias.  Neurological:  Negative for dizziness, weakness, light-headedness and headaches.  Psychiatric/Behavioral:  Negative for dysphoric mood. The patient is not nervous/anxious.     Objective:  BP 134/78   Pulse 78   Temp (!) 96.9 F (36.1 C)   Resp 18   Ht 5\' 3"  (1.6 m)   Wt 203 lb (92.1 kg)   BMI 35.96 kg/m      01/26/2022    9:11 AM 11/22/2021    1:57 PM 11/11/2021   11:06 AM  BP/Weight  Systolic BP 134 112 110   Diastolic BP 78 68 64  Wt. (Lbs) 203 200 189.2  BMI 35.96 kg/m2 35.43 kg/m2 33.52 kg/m2    Physical Exam Vitals reviewed.  Constitutional:      Appearance: Normal appearance. She is obese.  Neck:     Vascular: No carotid bruit.  Cardiovascular:     Rate and Rhythm: Normal rate and regular rhythm.     Heart sounds: Normal heart sounds.  Pulmonary:     Effort: Pulmonary effort is normal. No respiratory distress.     Breath sounds: Normal breath sounds.  Abdominal:     General: Abdomen is flat. Bowel sounds are normal.     Palpations: Abdomen is soft.     Tenderness: There is no abdominal tenderness.  Neurological:     General: No focal deficit present.     Mental Status: She is alert. Mental status is at baseline.     Gait: Gait abnormal (Wide-based, rocking).     Comments: Unable to speak words but follows directions.  Psychiatric:        Mood and Affect: Mood normal.        Behavior: Behavior normal.    Diabetic Foot Exam - Simple   No data filed      Lab Results  Component Value Date   WBC 12.1 (H) 04/27/2021   HGB 12.8 04/27/2021   HCT 38.2 04/27/2021   PLT 362 04/27/2021   GLUCOSE 101 (H) 04/27/2021   CHOL 150 01/21/2021   TRIG 73 01/21/2021   HDL 70 01/21/2021   LDLCALC 66 01/21/2021   ALT 26 04/27/2021   AST 31 04/27/2021   NA 140 04/27/2021   K 4.4 04/27/2021   CL 101 04/27/2021   CREATININE 0.58 04/27/2021   BUN 7 (L) 04/27/2021   CO2 29 04/27/2021   TSH 1.750 01/23/2020      Assessment & Plan:   Problem List Items Addressed This Visit       Cardiovascular and Mediastinum   Essential hypertension - Primary    Well controlled.  No changes to medicines.  Continue to work on eating a healthy diet and exercise.  Order Labs today.       Relevant Orders   CBC with Differential/Platelet     Other   Intellectual disability    Stable       Relevant Orders   TSH   Mixed hyperlipidemia    Well controlled.  No changes to medicines.  Taking Atorvastatin 10 mg daily. Continue to work on eating a healthy diet and exercise.  Order to check Lipid panel in.       Relevant Orders   Comprehensive metabolic panel   Lipid panel   TSH   Morbid obesity (HCC)  Recommend continue to work on eating healthy diet and exercise.       Other Visit Diagnoses     Need for vaccination for pneumococcus       Relevant Orders   Pneumococcal conjugate vaccine 20-valent     .  No orders of the defined types were placed in this encounter.   Orders Placed This Encounter  Procedures   Pneumococcal conjugate vaccine 20-valent   CBC with Differential/Platelet   Comprehensive metabolic panel   Lipid panel   TSH      Follow-up: Return in about 6 months (around 07/29/2022) for chronic fasting.  An After Visit Summary was printed and given to the patient.  Blane Ohara, MD [facility] [phone]

## 2022-01-27 ENCOUNTER — Ambulatory Visit: Payer: Medicare Other | Admitting: Family Medicine

## 2022-01-28 ENCOUNTER — Ambulatory Visit: Payer: Medicare Other

## 2022-01-29 NOTE — Assessment & Plan Note (Signed)
Well controlled.  No changes to medicines. Taking Atorvastatin 10 mg daily. Continue to work on eating a healthy diet and exercise.  Order to check Lipid panel in.

## 2022-01-29 NOTE — Assessment & Plan Note (Signed)
Well controlled.  No changes to medicines.  Continue to work on eating a healthy diet and exercise.  Order Labs today.

## 2022-01-29 NOTE — Assessment & Plan Note (Signed)
Recommend continue to work on eating healthy diet and exercise.  

## 2022-01-29 NOTE — Assessment & Plan Note (Signed)
Stable

## 2022-02-01 ENCOUNTER — Ambulatory Visit (INDEPENDENT_AMBULATORY_CARE_PROVIDER_SITE_OTHER): Payer: Medicare Other

## 2022-02-01 DIAGNOSIS — I10 Essential (primary) hypertension: Secondary | ICD-10-CM

## 2022-02-01 DIAGNOSIS — Z23 Encounter for immunization: Secondary | ICD-10-CM | POA: Diagnosis not present

## 2022-02-01 DIAGNOSIS — E782 Mixed hyperlipidemia: Secondary | ICD-10-CM

## 2022-02-01 DIAGNOSIS — F79 Unspecified intellectual disabilities: Secondary | ICD-10-CM

## 2022-02-02 LAB — CBC WITH DIFFERENTIAL/PLATELET
Basophils Absolute: 0 10*3/uL (ref 0.0–0.2)
Basos: 1 %
EOS (ABSOLUTE): 0.1 10*3/uL (ref 0.0–0.4)
Eos: 2 %
Hematocrit: 42.4 % (ref 34.0–46.6)
Hemoglobin: 14.3 g/dL (ref 11.1–15.9)
Immature Grans (Abs): 0 10*3/uL (ref 0.0–0.1)
Immature Granulocytes: 0 %
Lymphocytes Absolute: 1.7 10*3/uL (ref 0.7–3.1)
Lymphs: 28 %
MCH: 31 pg (ref 26.6–33.0)
MCHC: 33.7 g/dL (ref 31.5–35.7)
MCV: 92 fL (ref 79–97)
Monocytes Absolute: 0.5 10*3/uL (ref 0.1–0.9)
Monocytes: 8 %
Neutrophils Absolute: 3.9 10*3/uL (ref 1.4–7.0)
Neutrophils: 61 %
Platelets: 210 10*3/uL (ref 150–450)
RBC: 4.62 x10E6/uL (ref 3.77–5.28)
RDW: 12.7 % (ref 11.7–15.4)
WBC: 6.2 10*3/uL (ref 3.4–10.8)

## 2022-02-02 LAB — LIPID PANEL
Chol/HDL Ratio: 2.3 ratio (ref 0.0–4.4)
Cholesterol, Total: 154 mg/dL (ref 100–199)
HDL: 67 mg/dL (ref >39)
LDL Chol Calc (NIH): 73 mg/dL (ref 0–99)
Triglycerides: 69 mg/dL (ref 0–149)
VLDL Cholesterol Cal: 14 mg/dL (ref 5–40)

## 2022-02-02 LAB — COMPREHENSIVE METABOLIC PANEL
ALT: 20 IU/L (ref 0–32)
AST: 23 IU/L (ref 0–40)
Albumin/Globulin Ratio: 1.8 (ref 1.2–2.2)
Albumin: 4.2 g/dL (ref 3.8–4.8)
Alkaline Phosphatase: 92 IU/L (ref 44–121)
BUN/Creatinine Ratio: 18 (ref 12–28)
BUN: 14 mg/dL (ref 8–27)
Bilirubin Total: 0.4 mg/dL (ref 0.0–1.2)
CO2: 23 mmol/L (ref 20–29)
Calcium: 9.6 mg/dL (ref 8.7–10.3)
Chloride: 105 mmol/L (ref 96–106)
Creatinine, Ser: 0.79 mg/dL (ref 0.57–1.00)
Globulin, Total: 2.4 g/dL (ref 1.5–4.5)
Glucose: 98 mg/dL (ref 70–99)
Potassium: 4.1 mmol/L (ref 3.5–5.2)
Sodium: 143 mmol/L (ref 134–144)
Total Protein: 6.6 g/dL (ref 6.0–8.5)
eGFR: 83 mL/min/{1.73_m2} (ref >59)

## 2022-02-02 LAB — CARDIOVASCULAR RISK ASSESSMENT

## 2022-02-02 LAB — TSH: TSH: 1.62 u[IU]/mL (ref 0.450–4.500)

## 2022-02-17 ENCOUNTER — Other Ambulatory Visit: Payer: Self-pay | Admitting: Nurse Practitioner

## 2022-02-17 DIAGNOSIS — Z1211 Encounter for screening for malignant neoplasm of colon: Secondary | ICD-10-CM

## 2022-02-17 LAB — COLOGUARD

## 2022-03-28 ENCOUNTER — Encounter: Payer: Self-pay | Admitting: Podiatry

## 2022-03-28 ENCOUNTER — Ambulatory Visit (INDEPENDENT_AMBULATORY_CARE_PROVIDER_SITE_OTHER): Payer: Medicare Other | Admitting: Podiatry

## 2022-03-28 DIAGNOSIS — I739 Peripheral vascular disease, unspecified: Secondary | ICD-10-CM

## 2022-03-28 DIAGNOSIS — F79 Unspecified intellectual disabilities: Secondary | ICD-10-CM

## 2022-03-28 DIAGNOSIS — B353 Tinea pedis: Secondary | ICD-10-CM

## 2022-03-28 DIAGNOSIS — M79674 Pain in right toe(s): Secondary | ICD-10-CM

## 2022-03-28 DIAGNOSIS — B351 Tinea unguium: Secondary | ICD-10-CM

## 2022-03-28 DIAGNOSIS — L84 Corns and callosities: Secondary | ICD-10-CM | POA: Diagnosis not present

## 2022-03-28 DIAGNOSIS — M79675 Pain in left toe(s): Secondary | ICD-10-CM | POA: Diagnosis not present

## 2022-03-28 MED ORDER — CICLOPIROX 8 % EX SOLN
Freq: Every day | CUTANEOUS | 0 refills | Status: DC
Start: 2022-03-28 — End: 2022-07-11

## 2022-03-28 MED ORDER — KETOCONAZOLE 2 % EX CREA: 1.0000 | TOPICAL_CREAM | Freq: Every day | CUTANEOUS | 2 refills | Status: DC

## 2022-03-28 NOTE — Progress Notes (Signed)
Subjective: Lisa Montoya is a 66 y.o. female patient seen today in office with complaint of mildly painful thickened and elongated toenails and callus; unable to trim. Patient is assisted by facility aide who denies any changes with medical history since last encounter and reports she has had very try feet and fungus on her feet.   Patient Active Problem List   Diagnosis Date Noted   Other constipation 05/26/2021   Falls frequently 02/25/2020   BMI 40.0-44.9, adult (HCC) 02/25/2020   Morbid obesity (HCC) 02/25/2020   Mixed hyperlipidemia 01/16/2020   Elevated ferritin 01/16/2020   Intellectual disability 06/11/2019   Essential hypertension 06/11/2019    Current Outpatient Medications on File Prior to Visit  Medication Sig Dispense Refill   aspirin EC 81 MG tablet Take 81 mg by mouth daily.     atorvastatin (LIPITOR) 10 MG tablet Take 1 tablet (10 mg total) by mouth daily. 30 tablet 11   bisoprolol-hydrochlorothiazide (ZIAC) 10-6.25 MG tablet Take 0.5 tablets by mouth daily. 15 tablet 11   chlorhexidine (PERIDEX) 0.12 % solution SWISH & SPIT 1/2 OZ BY MOUTH FOR 30 SECONDS TWICE A DAY 473 mL 11   No current facility-administered medications on file prior to visit.    No Known Allergies  Objective: Physical Exam  General: Well developed, nourished, no acute distress, mental status is difficult to discern due to patient's intellectual disability  Vascular: Dorsalis pedis artery 1/4 bilateral, Posterior tibial artery 1/4 bilateral, skin temperature warm to warm proximal to distal bilateral lower extremities, minimal varicosities, minimal pedal hair present bilateral.  Neurological: Gross sensation present via light touch bilateral.   Dermatological: Skin is warm, dry, and supple bilateral, Nails 1-10 are tender, long, thick, and discolored with mild subungal debris, no webspace macerations present bilateral, callus/hyperkeratotic tissue present submet 1 on right>left.  No signs of  infection bilateral.  Musculoskeletal:Asymptomatic fat pad atrophy and prominent first metatarsal head boney deformities noted bilateral. Muscular strength within normal limits without painon range of motion. No pain with calf compression bilateral.  Assessment and Plan:  Problem List Items Addressed This Visit       Other   Intellectual disability   Other Visit Diagnoses     Pain due to onychomycosis of nail    -  Primary   Relevant Medications   ciclopirox (PENLAC) 8 % solution   ketoconazole (NIZORAL) 2 % cream   Callus       PVD (peripheral vascular disease) (HCC)       Tinea pedis of both feet       Relevant Medications   ciclopirox (PENLAC) 8 % solution   ketoconazole (NIZORAL) 2 % cream   Onychomycosis       Relevant Medications   ciclopirox (PENLAC) 8 % solution   ketoconazole (NIZORAL) 2 % cream        -Examined patient.  -Re-Discussed treatment options for painful mycotic nails and callus. -Mechanically debrided and reduced all painful mycotic nails with sterile nail nipper and dremel nail file without incident. -Smoothed callus using a rotary bur without incident bilateral -Recommend good supportive shoes daily for foot type and dispensed dancers pads for patient to use to further help with offloading to first metatarsal head since her insurance will not cover orthotics -Prescription for ketoconazole and penlac provided for fungal nails and feet.  -Patient to return in 3 months for follow-up nail and callus care or sooner if symptoms worsen.  Louann Sjogren, DPM

## 2022-05-30 ENCOUNTER — Other Ambulatory Visit: Payer: Self-pay

## 2022-06-16 ENCOUNTER — Ambulatory Visit: Payer: Medicare Other | Admitting: Podiatry

## 2022-06-24 ENCOUNTER — Ambulatory Visit (INDEPENDENT_AMBULATORY_CARE_PROVIDER_SITE_OTHER): Payer: Medicare Other | Admitting: Family Medicine

## 2022-06-24 ENCOUNTER — Encounter: Payer: Self-pay | Admitting: Family Medicine

## 2022-06-24 VITALS — BP 130/80 | HR 70 | Temp 97.8°F | Resp 14 | Ht 62.0 in | Wt 199.0 lb

## 2022-06-24 DIAGNOSIS — S63502A Unspecified sprain of left wrist, initial encounter: Secondary | ICD-10-CM

## 2022-06-24 DIAGNOSIS — S90111A Contusion of right great toe without damage to nail, initial encounter: Secondary | ICD-10-CM

## 2022-06-24 DIAGNOSIS — S66912A Strain of unspecified muscle, fascia and tendon at wrist and hand level, left hand, initial encounter: Secondary | ICD-10-CM | POA: Diagnosis not present

## 2022-06-24 NOTE — Progress Notes (Signed)
Subjective:  Patient ID: Lisa Montoya, female    DOB: Jul 31, 1956  Age: 67 y.o. MRN: [id number]  Chief Complaint  Patient presents with   Follow-up   Bleeding/Bruising    Left wrist and right toe    HPI Patient was seen at [facility] on 06/20/2025 for bruising on left wrist and pain on left foot. They did xray on right foot and it was negative. They discharged stable to home. No medication was given. Patient denies pain. Patient is mentally disabled. She is unable to tell me what happened. Neither is the care taker who is here with her. She is the one who discovered the injuries and took her to the hospital.  Current Outpatient Medications on File Prior to Visit  Medication Sig Dispense Refill   aspirin EC 81 MG tablet Take 81 mg by mouth daily.     atorvastatin (LIPITOR) 10 MG tablet Take 1 tablet (10 mg total) by mouth daily. 30 tablet 11   bisoprolol-hydrochlorothiazide (ZIAC) 10-6.25 MG tablet Take 0.5 tablets by mouth daily. 15 tablet 11   chlorhexidine (PERIDEX) 0.12 % solution SWISH & SPIT 1/2 OZ BY MOUTH FOR 30 SECONDS TWICE A DAY 473 mL 11   ciclopirox (PENLAC) 8 % solution Apply topically at bedtime. Apply over nail and surrounding skin. Apply daily over previous coat. After seven (7) days, may remove with alcohol and continue cycle. 6.6 mL 0   ibuprofen (ADVIL) 800 MG tablet Take 800 mg by mouth every 6 (six) hours as needed.     ketoconazole (NIZORAL) 2 % cream Apply 1 Application topically daily. 60 g 2   timolol (TIMOPTIC) 0.25 % ophthalmic solution SMARTSIG:In Eye(s)     No current facility-administered medications on file prior to visit.   Past Medical History:  Diagnosis Date   Essential hypertension    Mental disability    Mixed hyperlipidemia    Morbid obesity due to excess calories (HCC)    Pneumonia due to SARS-associated coronavirus    History reviewed. No pertinent surgical history.  Family History  Problem Relation Age of Onset   Breast cancer  Neg Hx    Social History   Socioeconomic History   Marital status: Single    Spouse name: Not on file   Number of children: Not on file   Years of education: Not on file   Highest education level: Not on file  Occupational History   Not on file  Tobacco Use   Smoking status: Never   Smokeless tobacco: Never  Substance and Sexual Activity   Alcohol use: Never   Drug use: Never   Sexual activity: Not Currently  Other Topics Concern   Not on file  Social History Narrative   Not on file   Social Determinants of Health   Financial Resource Strain: Low Risk  (11/22/2021)   Overall Financial Resource Strain (CARDIA)    Difficulty of Paying Living Expenses: Not hard at all  Food Insecurity: No Food Insecurity (11/22/2021)   Hunger Vital Sign    Worried About Running Out of Food in the Last Year: Never true    Ran Out of Food in the Last Year: Never true  Transportation Needs: No Transportation Needs (11/22/2021)   PRAPARE - Administrator, Civil Service (Medical): No    Lack of Transportation (Non-Medical): No  Physical Activity: Inactive (11/22/2021)   Exercise Vital Sign    Days of Exercise per Week: 0 days    Minutes of  Exercise per Session: 0 min  Stress: No Stress Concern Present (11/22/2021)   [employer] of Occupational Health - Occupational Stress Questionnaire    Feeling of Stress : Not at all  Social Connections: Socially Isolated (11/22/2021)   Social Connection and Isolation Panel [NHANES]    Frequency of Communication with Friends and Family: Never    Frequency of Social Gatherings with Friends and Family: Once a week    Attends Religious Services: Never    Database administrator or Organizations: No    Attends Engineer, structural: Never    Marital Status: Never married    Review of Systems  Constitutional:  Negative for chills, fatigue and fever.  HENT:  Negative for congestion, ear pain and sore throat.   Respiratory:  Negative for  cough and shortness of breath.   Cardiovascular:  Negative for chest pain and palpitations.  Gastrointestinal:  Negative for abdominal pain, constipation, diarrhea, nausea and vomiting.  Endocrine: Negative for polydipsia, polyphagia and polyuria.  Genitourinary:  Negative for difficulty urinating and dysuria.  Musculoskeletal:  Negative for arthralgias, back pain and myalgias.  Skin:  Positive for color change (black right great toe and bruise on left wrist). Negative for rash.  Neurological:  Negative for headaches.  Psychiatric/Behavioral:  Negative for dysphoric mood. The patient is not nervous/anxious.      Objective:  BP 130/80   Pulse 70   Temp 97.8 F (36.6 C)   Resp 14   Ht 5\' 2"  (1.575 m)   Wt 199 lb (90.3 kg)   BMI 36.40 kg/m      06/24/2022   10:06 AM 01/26/2022    9:11 AM 11/22/2021    1:57 PM  BP/Weight  Systolic BP 130 134 112  Diastolic BP 80 78 68  Wt. (Lbs) 199 203 200  BMI 36.4 kg/m2 35.96 kg/m2 35.43 kg/m2    Physical Exam Vitals reviewed.  Musculoskeletal:     Comments: Right great toe swollen, bruised, tender  Skin:    Findings: Bruising (left wrist. yellowing.) present.     Comments: NO other bruises.  Neurological:     Mental Status: She is alert.     Diabetic Foot Exam - Simple   No data filed      Lab Results  Component Value Date   WBC 6.2 02/01/2022   HGB 14.3 02/01/2022   HCT 42.4 02/01/2022   PLT 210 02/01/2022   GLUCOSE 98 02/01/2022   CHOL 154 02/01/2022   TRIG 69 02/01/2022   HDL 67 02/01/2022   LDLCALC 73 02/01/2022   ALT 20 02/01/2022   AST 23 02/01/2022   NA 143 02/01/2022   K 4.1 02/01/2022   CL 105 02/01/2022   CREATININE 0.79 02/01/2022   BUN 14 02/01/2022   CO2 23 02/01/2022   TSH 1.620 02/01/2022      Assessment & Plan:   Problem List Items Addressed This Visit       Musculoskeletal and Integument   Sprain and strain of left wrist    Healing.  Per caretaker with her, there is an investigation  going on.         Other   Contusion of right great toe without damage to nail - Primary    Healing.  Per caretaker with her, there is an investigation going on.      .  No orders of the defined types were placed in this encounter.   No orders of the defined types were  placed in this encounter.    Follow-up: No follow-ups on file.  An After Visit Summary was printed and given to the patient.  Rochel Brome, MD [facility] [phone]

## 2022-06-27 ENCOUNTER — Ambulatory Visit (INDEPENDENT_AMBULATORY_CARE_PROVIDER_SITE_OTHER): Payer: Medicare Other | Admitting: Podiatry

## 2022-06-27 ENCOUNTER — Encounter: Payer: Self-pay | Admitting: Podiatry

## 2022-06-27 ENCOUNTER — Ambulatory Visit (INDEPENDENT_AMBULATORY_CARE_PROVIDER_SITE_OTHER): Payer: Medicare Other

## 2022-06-27 DIAGNOSIS — S99921A Unspecified injury of right foot, initial encounter: Secondary | ICD-10-CM

## 2022-06-27 DIAGNOSIS — I739 Peripheral vascular disease, unspecified: Secondary | ICD-10-CM

## 2022-06-27 DIAGNOSIS — F79 Unspecified intellectual disabilities: Secondary | ICD-10-CM

## 2022-06-27 DIAGNOSIS — M79609 Pain in unspecified limb: Secondary | ICD-10-CM | POA: Diagnosis not present

## 2022-06-27 DIAGNOSIS — B351 Tinea unguium: Secondary | ICD-10-CM

## 2022-06-27 DIAGNOSIS — S92414A Nondisplaced fracture of proximal phalanx of right great toe, initial encounter for closed fracture: Secondary | ICD-10-CM | POA: Diagnosis not present

## 2022-06-27 NOTE — Progress Notes (Signed)
  Subjective:  Patient ID: Lisa Montoya, female    DOB: 07-25-1956,  MRN: [id number]  Chief Complaint  Patient presents with   Foot Injury    Right great toe fracture went to Ed on 10.15.2023   Nail Problem    Routine foot care     66 y.o. female presents with the above complaint.  Patient has a history of intellectual disability presents with a care aide.  Patient's aide says that the patient sustained a right great toe injury approximately 1 week ago.  There was some bruising and swelling of the area.  They went to the emergency department on June 19, 2022 and were told that there was a small fracture.  The patient has been walking without issue in regular shoe gear since that time.  Also here for painful thickened elongated abnormally growing nails x5 both feet comes in for routine nail care.  Objective:  Physical Exam: warm, good capillary refill, DP and PT pulses 2/4 bilateral nail exam onychomycosis of the toenails, onycholysis, and dystrophic nails DP pulses palpable, PT pulses palpable, and protective sensation intact Left Foot:  Pain with palpation of nails due to elongation and dystrophic growth.   Right Foot: Pain with palpation of the right hallux proximal phalanx.  Ecchymosis and mild edema noted to the right hallux.  No open wound present.  Pain with palpation of nails due to elongation and dystrophic growth.   XR foot right AP lateral oblique weightbearing Attention directed to the base of the proximal phalanx on the medial aspect there is possible nondisplaced avulsion fracture of the medial base of the proximal phalanx.  Difficult to discern on these x-rays.    Assessment:   1. Closed nondisplaced fracture of proximal phalanx of right great toe, initial encounter   2. Toe injury, right, initial encounter   3. Pain due to onychomycosis of nail   4. PVD (peripheral vascular disease) ([place])   5. Intellectual disability   6. Onychomycosis      Plan:  Patient was  evaluated and treated and all questions answered.  #Possible nondisplaced fracture of the proximal phalanx base right great toe -Reviewed the x-rays with the patient and the aide.  Possible small nondisplaced chip type fracture of the base of the proximal phalanx medial side right great toe. -Discussed this will heal with conservative care recommend protected weightbearing in regular shoe gear at this time. -Discussed that if the pain does not decrease within another 4 to 6 weeks to have the patient come back in the head of routine foot care appointment which was scheduled for 3 months.  #Onychomycosis with pain  -Nails palliatively debrided as below. -Educated on self-care  Procedure: Nail Debridement Rationale: Pain Type of Debridement: manual, sharp debridement. Instrumentation: Nail nipper, rotary burr. Number of Nails: 10  Return in about 3 months (around 09/27/2022) for RFC.         Everitt Amber, DPM [facility]

## 2022-06-30 ENCOUNTER — Ambulatory Visit: Payer: Medicare Other | Admitting: Podiatry

## 2022-07-01 DIAGNOSIS — S90111A Contusion of right great toe without damage to nail, initial encounter: Secondary | ICD-10-CM | POA: Insufficient documentation

## 2022-07-01 DIAGNOSIS — S63502A Unspecified sprain of left wrist, initial encounter: Secondary | ICD-10-CM | POA: Insufficient documentation

## 2022-07-03 NOTE — Assessment & Plan Note (Signed)
Healing.  Per caretaker with her, there is an investigation going on.  

## 2022-07-03 NOTE — Assessment & Plan Note (Signed)
Healing.  Per caretaker with her, there is an investigation going on.

## 2022-07-07 ENCOUNTER — Ambulatory Visit (INDEPENDENT_AMBULATORY_CARE_PROVIDER_SITE_OTHER): Payer: Medicare Other

## 2022-07-07 DIAGNOSIS — Z23 Encounter for immunization: Secondary | ICD-10-CM

## 2022-07-08 ENCOUNTER — Other Ambulatory Visit: Payer: Self-pay | Admitting: Podiatry

## 2022-07-20 ENCOUNTER — Encounter: Payer: Self-pay | Admitting: Nurse Practitioner

## 2022-07-20 ENCOUNTER — Ambulatory Visit: Payer: Medicare Other | Admitting: Nurse Practitioner

## 2022-07-20 ENCOUNTER — Ambulatory Visit (INDEPENDENT_AMBULATORY_CARE_PROVIDER_SITE_OTHER): Payer: Medicare Other | Admitting: Nurse Practitioner

## 2022-07-20 VITALS — BP 118/74 | HR 59 | Temp 97.5°F | Ht 62.0 in | Wt 203.0 lb

## 2022-07-20 DIAGNOSIS — H1031 Unspecified acute conjunctivitis, right eye: Secondary | ICD-10-CM | POA: Diagnosis not present

## 2022-07-20 MED ORDER — NEOMYCIN-POLYMYXIN-DEXAMETH 3.5-10000-0.1 OP SUSP: 2.0000 [drp] | Freq: Four times a day (QID) | OPHTHALMIC | 0 refills | Status: AC

## 2022-07-20 NOTE — Patient Instructions (Addendum)
Instill 2 drops antibiotic solution to right eye four times daily  May apply warm moist compress to right eye Avoid rubbing eye Follow-up as needed  Bacterial Conjunctivitis, Adult Bacterial conjunctivitis is an infection of the clear membrane that covers the white part of the eye and the inner surface of the eyelid (conjunctiva). When the blood vessels in the conjunctiva become inflamed, the eye becomes red or pink. The eye often feels irritated or itchy. Bacterial conjunctivitis spreads easily from person to person (is contagious). It also spreads easily from one eye to the other eye. What are the causes? This condition is caused by bacteria. You may get the infection if you come into close contact with: A person who is infected with the bacteria. Items that are contaminated with the bacteria, such as a face towel, contact lens solution, or eye makeup. What increases the risk? You are more likely to develop this condition if: You are exposed to other people who have the infection. You wear contact lenses. You have a sinus infection. You have had a recent eye injury or surgery. You have a weak body defense system (immune system). You have a medical condition that causes dry eyes. What are the signs or symptoms? Symptoms of this condition include: Thick, yellowish discharge from the eye. This may turn into a crust on the eyelid overnight and cause your eyelids to stick together. Tearing or watery eyes. Itchy eyes. Burning feeling in your eyes. Eye redness. Swollen eyelids. Blurred vision. How is this diagnosed? This condition is diagnosed based on your symptoms and medical history. Your health care provider may also take a sample of discharge from your eye to find the cause of your infection. How is this treated? This condition may be treated with: Antibiotic eye drops or ointment to clear the infection more quickly and prevent the spread of infection to others. Antibiotic medicines  taken by mouth (orally) to treat infections that do not respond to drops or ointments or that last longer than 10 days. Cool, wet cloths (cool compresses) placed on the eyes. Artificial tears applied 2-6 times a day. Follow these instructions at home: Medicines Take or apply your antibiotic medicine as told by your health care provider. Do not stop using the antibiotic, even if your condition improves, unless directed by your health care provider. Take or apply over-the-counter and prescription medicines only as told by your health care provider. Be very careful to avoid touching the edge of your eyelid with the eye-drop bottle or the ointment tube when you apply medicines to the affected eye. This will keep you from spreading the infection to your other eye or to other people. Managing discomfort Gently wipe away any drainage from your eye with a warm, wet washcloth or a cotton ball. Apply a clean, cool compress to your eye for 10-20 minutes, 3-4 times a day. General instructions Do not wear contact lenses until the inflammation is gone and your health care provider says it is safe to wear them again. Ask your health care provider how to sterilize or replace your contact lenses before you use them again. Wear glasses until you can resume wearing contact lenses. Avoid wearing eye makeup until the inflammation is gone. Throw away any old eye cosmetics that may be contaminated. Change or wash your pillowcase every day. Do not share towels or washcloths. This may spread the infection. Wash your hands often with soap and water for at least 20 seconds and especially before touching your face or  eyes. Use paper towels to dry your hands. Avoid touching or rubbing your eyes. Do not drive or use heavy machinery if your vision is blurred. Contact a health care provider if: You have a fever. Your symptoms do not get better after 10 days. Get help right away if: You have a fever and your symptoms suddenly  get worse. You have severe pain when you move your eye. You have facial pain, redness, or swelling. You have a sudden loss of vision. Summary Bacterial conjunctivitis is an infection of the clear membrane that covers the white part of the eye and the inner surface of the eyelid (conjunctiva). Bacterial conjunctivitis spreads easily from eye to eye and from person to person (is contagious). Wash your hands often with soap and water for at least 20 seconds and especially before touching your face or eyes. Use paper towels to dry your hands. Take or apply your antibiotic medicine as told by your health care provider. Do not stop using the antibiotic even if your condition improves. Contact a health care provider if you have a fever or if your symptoms do not get better after 10 days. Get help right away if you have a sudden loss of vision. This information is not intended to replace advice given to you by your health care provider. Make sure you discuss any questions you have with your health care provider. Document Revised: 12/02/2020 Document Reviewed: 12/02/2020 Elsevier Patient Education  2023 [employer].

## 2022-07-20 NOTE — Progress Notes (Signed)
Acute Office Visit  Subjective:    Patient ID: Lisa Montoya, female    DOB: [birth year], 66 y.o.   MRN: [id number]  Chief Complaint  Patient presents with   Conjunctivitis    HPI: Patient is a resident of local group home, she is accompanied by staff member who helps supplement history. Pt is non-verbal with intellectual disabilities.  Onset of symptoms was yesterday. Pt has been rubbing eye per group home staff member. Treatment has included otc eye drops. Staff member reports no other group home residents or staff members have had similar symptoms.   Past Medical History:  Diagnosis Date   Essential hypertension    Mental disability    Mixed hyperlipidemia    Morbid obesity due to excess calories ([place])    Pneumonia due to SARS-associated coronavirus     No past surgical history on file.  Family History  Problem Relation Age of Onset   Breast cancer Neg Hx     Social History   Socioeconomic History   Marital status: Single    Spouse name: Not on file   Number of children: Not on file   Years of education: Not on file   Highest education level: Not on file  Occupational History   Not on file  Tobacco Use   Smoking status: Never   Smokeless tobacco: Never  Substance and Sexual Activity   Alcohol use: Never   Drug use: Never   Sexual activity: Not Currently  Other Topics Concern   Not on file  Social History Narrative   Not on file   Social Determinants of Health   Financial Resource Strain: Low Risk  (11/22/2021)   Overall Financial Resource Strain (CARDIA)    Difficulty of Paying Living Expenses: Not hard at all  Food Insecurity: No Food Insecurity (11/22/2021)   Hunger Vital Sign    Worried About Running Out of Food in the Last Year: Never true    [place] in the Last Year: Never true  Transportation Needs: No Transportation Needs (11/22/2021)   PRAPARE - Hydrologist (Medical): No    Lack of Transportation  (Non-Medical): No  Physical Activity: Inactive (11/22/2021)   Exercise Vital Sign    Days of Exercise per Week: 0 days    Minutes of Exercise per Session: 0 min  Stress: No Stress Concern Present (11/22/2021)   [place]    Feeling of Stress : Not at all  Social Connections: Socially Isolated (11/22/2021)   Social Connection and Isolation Panel [NHANES]    Frequency of Communication with Friends and Family: Never    Frequency of Social Gatherings with Friends and Family: Once a week    Attends Religious Services: Never    Marine scientist or Organizations: No    Attends Archivist Meetings: Never    Marital Status: Never married  Intimate Partner Violence: Not At Risk (11/22/2021)   Humiliation, Afraid, Rape, and Kick questionnaire    Fear of Current or Ex-Partner: No    Emotionally Abused: No    Physically Abused: No    Sexually Abused: No    Outpatient Medications Prior to Visit  Medication Sig Dispense Refill   aspirin EC 81 MG tablet Take 81 mg by mouth daily.     atorvastatin (LIPITOR) 10 MG tablet Take 1 tablet (10 mg total) by mouth daily. 30 tablet 11   bisoprolol-hydrochlorothiazide [facility])  10-6.25 MG tablet Take 0.5 tablets by mouth daily. 15 tablet 11   chlorhexidine (PERIDEX) 0.12 % solution SWISH & SPIT 1/2 OZ BY MOUTH FOR 30 SECONDS TWICE A DAY 473 mL 11   ciclopirox (PENLAC) 8 % solution APPLY OVER NAIL AND SURROUNDING SKIN DAILY AT BEDTIME. APPLY OVER PREVIOUS COATS. AFTER 7 DAYS, REMOVE WITH ALCOHOL AND CONTINUE CYCLE. 6.6 mL 10   ibuprofen (ADVIL) 800 MG tablet Take 800 mg by mouth every 6 (six) hours as needed.     ketoconazole (NIZORAL) 2 % cream Apply 1 Application topically daily. 60 g 2   timolol (TIMOPTIC) 0.25 % ophthalmic solution SMARTSIG:In Eye(s)     No facility-administered medications prior to visit.    No Known Allergies  Review of Systems  Unable to perform ROS:  Patient nonverbal      Objective:    Physical Exam Vitals reviewed.  Eyes:     Extraocular Movements: Extraocular movements intact.     Conjunctiva/sclera:     Right eye: Right conjunctiva is injected. Hemorrhage present.  Neurological:     Mental Status: She is alert. Mental status is at baseline.    BP 118/74   Pulse (!) 59   Temp (!) 97.5 F (36.4 C)   Wt 203 lb (92.1 kg)   SpO2 100%   BMI 37.13 kg/m  Wt Readings from Last 3 Encounters:  07/20/22 203 lb (92.1 kg)  06/24/22 199 lb (90.3 kg)  01/26/22 203 lb (92.1 kg)    Health Maintenance Due  Topic Date Due   Zoster Vaccines- Shingrix (1 of 2) Never done   COLONOSCOPY (Pts 45-30yr Insurance coverage will need to be confirmed)  04/12/2021   DEXA SCAN  Never done   COVID-19 Vaccine (4 - Moderna series) 11/23/2021       Lab Results  Component Value Date   TSH 1.620 02/01/2022   Lab Results  Component Value Date   WBC 6.2 02/01/2022   HGB 14.3 02/01/2022   HCT 42.4 02/01/2022   MCV 92 02/01/2022   PLT 210 02/01/2022   Lab Results  Component Value Date   NA 143 02/01/2022   K 4.1 02/01/2022   CO2 23 02/01/2022   GLUCOSE 98 02/01/2022   BUN 14 02/01/2022   CREATININE 0.79 02/01/2022   BILITOT 0.4 02/01/2022   ALKPHOS 92 02/01/2022   AST 23 02/01/2022   ALT 20 02/01/2022   PROT 6.6 02/01/2022   ALBUMIN 4.2 02/01/2022   CALCIUM 9.6 02/01/2022   ANIONGAP 10 06/16/2019   EGFR 83 02/01/2022   Lab Results  Component Value Date   CHOL 154 02/01/2022   Lab Results  Component Value Date   HDL 67 02/01/2022   Lab Results  Component Value Date   LDLCALC 73 02/01/2022   Lab Results  Component Value Date   TRIG 69 02/01/2022   Lab Results  Component Value Date   CHOLHDL 2.3 02/01/2022        Assessment & Plan:   1. Acute bacterial conjunctivitis of right eye - neomycin-polymyxin b-dexamethasone (MAXITROL) 3.5-10000-0.1 SUSP; Place 2 drops into the right eye every 6 (six) hours.   Dispense: 5 mL; Refill: 0   Instill 2 drops antibiotic solution to right eye four times daily  May apply warm moist compress to right eye Avoid rubbing eye Follow-up as needed   Follow-up: PRN  An After Visit Summary was printed and given to the patient.  I, SRip Harbour NP, have reviewed all  documentation for this visit. The documentation on 07/20/22 for the exam, diagnosis, procedures, and orders are all accurate and complete.    Signed, Rip Harbour, NP [place] [phone]

## 2022-08-01 ENCOUNTER — Ambulatory Visit (INDEPENDENT_AMBULATORY_CARE_PROVIDER_SITE_OTHER): Payer: Medicare Other | Admitting: Family Medicine

## 2022-08-01 ENCOUNTER — Encounter: Payer: Self-pay | Admitting: Family Medicine

## 2022-08-01 VITALS — BP 120/80 | HR 92 | Temp 97.7°F | Resp 15 | Ht 62.0 in | Wt 198.0 lb

## 2022-08-01 DIAGNOSIS — E782 Mixed hyperlipidemia: Secondary | ICD-10-CM | POA: Diagnosis not present

## 2022-08-01 DIAGNOSIS — F79 Unspecified intellectual disabilities: Secondary | ICD-10-CM | POA: Diagnosis not present

## 2022-08-01 DIAGNOSIS — Z6836 Body mass index (BMI) 36.0-36.9, adult: Secondary | ICD-10-CM

## 2022-08-01 DIAGNOSIS — I10 Essential (primary) hypertension: Secondary | ICD-10-CM | POA: Diagnosis not present

## 2022-08-01 NOTE — Progress Notes (Unsigned)
Subjective:  Patient ID: Lisa Montoya, female    DOB: November 29, 1955  Age: 66 y.o. MRN: [id number]  Chief Complaint  Patient presents with   Hypertension   Hyperlipidemia    HPI   Patient is a 66 year old intellectually disabled, minimally verbal white female who presents for chronic follow-up of hyperlipidemia, hypertension, and morbid obesity.  She lives in a group home.  She is here with one of the caretakers from the home.  She does not exercise.  The home tries to provide a fairly healthy diet.  Hypertension: She is taking Bisoprolol-Hctz  10-6.25 mg 1/2 tablet daily, aspirin 81 mg daily.  Hyperlipidemia: Taking Atorvastatin 10 mg daily.  Current Outpatient Medications on File Prior to Visit  Medication Sig Dispense Refill   aspirin EC 81 MG tablet Take 81 mg by mouth daily.     atorvastatin (LIPITOR) 10 MG tablet Take 1 tablet (10 mg total) by mouth daily. 30 tablet 11   bisoprolol-hydrochlorothiazide (ZIAC) 10-6.25 MG tablet Take 0.5 tablets by mouth daily. 15 tablet 11   chlorhexidine (PERIDEX) 0.12 % solution SWISH & SPIT 1/2 OZ BY MOUTH FOR 30 SECONDS TWICE A DAY 473 mL 11   ciclopirox (PENLAC) 8 % solution APPLY OVER NAIL AND SURROUNDING SKIN DAILY AT BEDTIME. APPLY OVER PREVIOUS COATS. AFTER 7 DAYS, REMOVE WITH ALCOHOL AND CONTINUE CYCLE. 6.6 mL 10   ibuprofen (ADVIL) 800 MG tablet Take 800 mg by mouth every 6 (six) hours as needed.     ketoconazole (NIZORAL) 2 % cream Apply 1 Application topically daily. 60 g 2   neomycin-polymyxin b-dexamethasone (MAXITROL) 3.5-10000-0.1 SUSP Place 2 drops into the right eye every 6 (six) hours. 5 mL 0   timolol (TIMOPTIC) 0.25 % ophthalmic solution SMARTSIG:In Eye(s)     No current facility-administered medications on file prior to visit.   Past Medical History:  Diagnosis Date   Essential hypertension    Mental disability    Mixed hyperlipidemia    Morbid obesity due to excess calories ([facility])    Pneumonia due to SARS-associated  coronavirus    History reviewed. No pertinent surgical history.  Family History  Problem Relation Age of Onset   Breast cancer Neg Hx    Social History   Socioeconomic History   Marital status: Single    Spouse name: Not on file   Number of children: Not on file   Years of education: Not on file   Highest education level: Not on file  Occupational History   Not on file  Tobacco Use   Smoking status: Never   Smokeless tobacco: Never  Substance and Sexual Activity   Alcohol use: Never   Drug use: Never   Sexual activity: Not Currently  Other Topics Concern   Not on file  Social History Narrative   Not on file   Social Determinants of Health   Financial Resource Strain: Low Risk  (11/22/2021)   Overall Financial Resource Strain (CARDIA)    Difficulty of Paying Living Expenses: Not hard at all  Food Insecurity: No Food Insecurity (11/22/2021)   Hunger Vital Sign    Worried About Running Out of Food in the Last Year: Never true    Ran Out of Food in the Last Year: Never true  Transportation Needs: No Transportation Needs (11/22/2021)   PRAPARE - Administrator, Civil Service (Medical): No    Lack of Transportation (Non-Medical): No  Physical Activity: Inactive (11/22/2021)   Exercise Vital Sign  Days of Exercise per Week: 0 days    Minutes of Exercise per Session: 0 min  Stress: No Stress Concern Present (11/22/2021)   [employer] of Occupational Health - Occupational Stress Questionnaire    Feeling of Stress : Not at all  Social Connections: Socially Isolated (11/22/2021)   Social Connection and Isolation Panel [NHANES]    Frequency of Communication with Friends and Family: Never    Frequency of Social Gatherings with Friends and Family: Once a week    Attends Religious Services: Never    Database administrator or Organizations: No    Attends Banker Meetings: Never    Marital Status: Never married    Review of Systems  Constitutional:   Negative for chills, fatigue and fever.  HENT:  Negative for congestion, ear pain and sore throat.   Respiratory:  Negative for cough and shortness of breath.   Cardiovascular:  Negative for chest pain and palpitations.  Gastrointestinal:  Negative for abdominal pain, constipation, diarrhea, nausea and vomiting.  Endocrine: Negative for polydipsia, polyphagia and polyuria.  Genitourinary:  Negative for difficulty urinating and dysuria.  Musculoskeletal:  Negative for arthralgias, back pain and myalgias.  Skin:  Negative for rash.  Neurological:  Negative for headaches.  Psychiatric/Behavioral:  Negative for dysphoric mood. The patient is not nervous/anxious.      Objective:  BP 120/80   Pulse 92   Temp 97.7 F (36.5 C)   Resp 15   Ht 5\' 2"  (1.575 m)   Wt 198 lb (89.8 kg)   SpO2 91%   BMI 36.21 kg/m      08/01/2022    9:21 AM 07/20/2022    1:32 PM 06/24/2022   10:06 AM  BP/Weight  Systolic BP 120 118 130  Diastolic BP 80 74 80  Wt. (Lbs) 198 203 199  BMI 36.21 kg/m2 37.13 kg/m2 36.4 kg/m2    Physical Exam Vitals reviewed.  Constitutional:      Appearance: Normal appearance. She is obese.  Neck:     Vascular: No carotid bruit.  Cardiovascular:     Rate and Rhythm: Normal rate and regular rhythm.     Heart sounds: Normal heart sounds.  Pulmonary:     Effort: Pulmonary effort is normal. No respiratory distress.     Breath sounds: Normal breath sounds.  Abdominal:     General: Abdomen is flat.     Palpations: Abdomen is soft.     Tenderness: There is no abdominal tenderness.  Neurological:     Mental Status: She is alert.     Gait: Gait abnormal.     Comments: Nonverbal  Psychiatric:        Mood and Affect: Mood normal.        Behavior: Behavior normal.     Diabetic Foot Exam - Simple   No data filed      Lab Results  Component Value Date   WBC 6.2 02/01/2022   HGB 14.3 02/01/2022   HCT 42.4 02/01/2022   PLT 210 02/01/2022   GLUCOSE 98 02/01/2022    CHOL 154 02/01/2022   TRIG 69 02/01/2022   HDL 67 02/01/2022   LDLCALC 73 02/01/2022   ALT 20 02/01/2022   AST 23 02/01/2022   NA 143 02/01/2022   K 4.1 02/01/2022   CL 105 02/01/2022   CREATININE 0.79 02/01/2022   BUN 14 02/01/2022   CO2 23 02/01/2022   TSH 1.620 02/01/2022      Assessment &  Plan:   Problem List Items Addressed This Visit       Cardiovascular and Mediastinum   Essential hypertension - Primary    Well controlled.  No changes to medicines. aking Bisoprolol-Hctz  10-6.25 mg 1/2 tablet daily, aspirin 81 mg daily. Continue to work on eating a healthy diet and exercise.  Labs drawn today.          Other   Intellectual disability    Stable      Mixed hyperlipidemia    Well controlled.  No changes to medicines. Taking Atorvastatin 10 mg daily. Continue to work on eating a healthy diet and exercise.  Labs drawn today.        Morbid obesity (HCC)  .  No orders of the defined types were placed in this encounter.   No orders of the defined types were placed in this encounter.    Follow-up: No follow-ups on file.  An After Visit Summary was printed and given to the patient.  I,Priscila Bean,acting as a Neurosurgeon for Blane Ohara, MD.,have documented all relevant documentation on the behalf of Blane Ohara, MD,as directed by  Blane Ohara, MD while in the presence of Blane Ohara, MD.   Blane Ohara, MD [facility] [phone]

## 2022-08-01 NOTE — Assessment & Plan Note (Signed)
Well controlled.  No changes to medicines. aking Bisoprolol-Hctz  10-6.25 mg 1/2 tablet daily, aspirin 81 mg daily. Continue to work on eating a healthy diet and exercise.  Labs drawn today.

## 2022-08-01 NOTE — Assessment & Plan Note (Addendum)
Stable. Stays in adult group home.

## 2022-08-01 NOTE — Assessment & Plan Note (Signed)
Well controlled.  No changes to medicines. Taking Atorvastatin 10 mg daily. Continue to work on eating a healthy diet and exercise.  Labs drawn today.

## 2022-08-01 NOTE — Patient Instructions (Signed)
Recommend shingrix vaccine series at the pharmacy.   Check if wishes to get newest covid booster.   Redo referral to Gastroenterology.

## 2022-08-02 NOTE — Assessment & Plan Note (Signed)
Comorbidities include hypertension and hyperlipidemia.

## 2022-08-05 ENCOUNTER — Ambulatory Visit: Payer: Medicare Other | Admitting: Family Medicine

## 2022-08-18 ENCOUNTER — Telehealth: Payer: Self-pay

## 2022-08-18 NOTE — Telephone Encounter (Signed)
Dr. Sedalia Muta,   Lisa Montoya called this morning she was unsure if the patient needs an appointment or of an order can be sent for depends diapers so her insurance will cover it. She is unsure what company would provide them for the patient.  Please advise.   Note: Please call [name] back.

## 2022-08-22 NOTE — Telephone Encounter (Signed)
Per Dr. Sedalia Muta: Please make an appointment with [facility] for incontinence documentation/evaluation in order to get adult diapers for her. Dr. Sedalia Muta.  I tried to call [name] this afternoon to get an appointment scheduled for Lisa Montoya for incontinence documentation/evaluation in order to get adult diapers for her. I have left a message for [name] to call the office back to get an appointment scheduled.

## 2022-08-31 ENCOUNTER — Ambulatory Visit: Payer: Medicare Other | Admitting: Family Medicine

## 2022-09-08 ENCOUNTER — Ambulatory Visit: Payer: Medicare Other | Admitting: Family Medicine

## 2022-09-09 ENCOUNTER — Ambulatory Visit (INDEPENDENT_AMBULATORY_CARE_PROVIDER_SITE_OTHER): Payer: Medicare Other | Admitting: Nurse Practitioner

## 2022-09-09 ENCOUNTER — Encounter: Payer: Self-pay | Admitting: Nurse Practitioner

## 2022-09-09 VITALS — BP 130/80 | HR 68 | Temp 97.0°F | Resp 14 | Ht 62.0 in | Wt 206.0 lb

## 2022-09-09 DIAGNOSIS — N39498 Other specified urinary incontinence: Secondary | ICD-10-CM

## 2022-09-09 DIAGNOSIS — Z603 Acculturation difficulty: Secondary | ICD-10-CM

## 2022-09-09 DIAGNOSIS — R32 Unspecified urinary incontinence: Secondary | ICD-10-CM | POA: Diagnosis not present

## 2022-09-09 MED ORDER — INCONTINENCE SUPPLIES KIT: 1.0000 | PACK | Freq: Every day | 11 refills | Status: DC

## 2022-09-09 MED ORDER — INCONTINENCE SUPPLIES KIT: 1.0000 | PACK | Freq: Every day | 11 refills | Status: AC

## 2022-09-09 NOTE — Patient Instructions (Signed)
Urinary Incontinence Urinary incontinence refers to a condition in which a person is unable to control where and when to pass urine. A person with this condition will urinate involuntarily. This means that the person urinates when he or she does not mean to. What are the causes? This condition may be caused by: Medicines. Infections. Constipation. Overactive bladder muscles. Weak bladder muscles. Weak pelvic floor muscles. These muscles provide support for the bladder, intestine, and, in women, the uterus. Enlarged prostate in men. The prostate is a gland near the bladder. When it gets too big, it can pinch the urethra. With the urethra blocked, the bladder can weaken and lose the ability to empty properly. Surgery. Emotional factors, such as anxiety, stress, or post-traumatic stress disorder (PTSD). Spinal cord injury, nerve injury, or other neurological conditions. Pelvic organ prolapse. This happens in women when organs move out of place and into the vagina. This movement can prevent the bladder and urethra from working properly. What increases the risk? The following factors may make you more likely to develop this condition: Age. The older you are, the higher the risk. Obesity. Being physically inactive. Pregnancy and childbirth. Menopause. Diseases that affect the nerves or spinal cord. Long-term, or chronic, coughing. This can increase pressure on the bladder and pelvic floor muscles. What are the signs or symptoms? Symptoms may vary depending on the type of urinary incontinence you have. They include: A sudden urge to urinate, and passing urine involuntarily before you can get to a bathroom (urge incontinence). Suddenly passing urine when doing activities that force urine to pass, such as coughing, laughing, exercising, or sneezing (stress incontinence). Needing to urinate often but urinating only a small amount, or constantly dribbling urine (overflow incontinence). Urinating  because you cannot get to the bathroom in time due to a physical disability, such as arthritis or injury, or due to a communication or thinking problem, such as Alzheimer's disease (functional incontinence). How is this diagnosed? This condition may be diagnosed based on: Your medical history. A physical exam. Tests, such as: Urine tests. X-rays of your kidney and bladder. Ultrasound. CT scan. Cystoscopy. In this procedure, a health care provider inserts a tube with a light and camera (cystoscope) through the urethra and into the bladder to check for problems. Urodynamic testing. These tests assess how well the bladder, urethra, and sphincter can store and release urine. There are different types of urodynamic tests, and they vary depending on what the test is measuring. To help diagnose your condition, your health care provider may recommend that you keep a log of when you urinate and how much you urinate. How is this treated? Treatment for this condition depends on the type of incontinence that you have and its cause. Treatment may include: Lifestyle changes, such as: Quitting smoking. Maintaining a healthy weight. Staying active. Try to get 150 minutes of moderate-intensity exercise every week. Ask your health care provider which activities are safe for you. Eating a healthy diet. Avoid high-fat foods, like fried foods. Avoid refined carbohydrates like white bread and white rice. Limit how much alcohol and caffeine you drink. Increase your fiber intake. Healthy sources of fiber include beans, whole grains, and fresh fruits and vegetables. Behavioral changes, such as: Pelvic floor muscle exercises. Bladder training, such as lengthening the amount of time between bathroom breaks, or using the bathroom at regular intervals. Using techniques to suppress bladder urges. This can include distraction techniques or controlled breathing exercises. Medicines, such as: Medicines to relax the  bladder   muscles and prevent bladder spasms. Medicines to help slow or prevent the growth of a man's prostate. Botox injections. These can help relax the bladder muscles. Treatments, such as: Using pulses of electricity to help change bladder reflexes (electrical nerve stimulation). For women, using a medical device to prevent urine leaks. This is a small, tampon-like, disposable device that is inserted into the urethra. Injecting collagen or carbon beads (bulking agents) into the urinary sphincter. These can help thicken tissue and close the bladder opening. Surgery. Follow these instructions at home: Lifestyle Limit alcohol and caffeine. These can fill your bladder quickly and irritate it. Keep yourself clean to help prevent odors and skin damage. Ask your health care provider about special skin creams and cleansers that can protect the skin from urine. Consider wearing pads or adult diapers. Make sure to change them regularly, and always change them right after experiencing incontinence. General instructions Take over-the-counter and prescription medicines only as told by your health care provider. Use the bathroom about every 3-4 hours, even if you do not feel the need to urinate. Try to empty your bladder completely every time. After urinating, wait a minute. Then try to urinate again. Make sure you are in a relaxed position while urinating. If your incontinence is caused by nerve problems, keep a log of the medicines you take and the times you go to the bathroom. Keep all follow-up visits. This is important. Where to find more information National Institute of Diabetes and Digestive and Kidney Diseases: www.niddk.nih.gov American Urology Association: www.urologyhealth.org Contact a health care provider if: You have pain that gets worse. Your incontinence gets worse. Get help right away if: You have a fever or chills. You are unable to urinate. You have redness in your groin area or  down your legs. Summary Urinary incontinence refers to a condition in which a person is unable to control where and when to pass urine. This condition may be caused by medicines, infection, weak bladder muscles, weak pelvic floor muscles, enlargement of the prostate (in men), or surgery. Factors such as older age, obesity, pregnancy and childbirth, menopause, neurological diseases, and chronic coughing may increase your risk for developing this condition. Types of urinary incontinence include urge incontinence, stress incontinence, overflow incontinence, and functional incontinence. This condition is usually treated first with lifestyle and behavioral changes, such as quitting smoking, eating a healthier diet, and doing regular pelvic floor exercises. Other treatment options include medicines, bulking agents, medical devices, electrical nerve stimulation, or surgery. This information is not intended to replace advice given to you by your health care provider. Make sure you discuss any questions you have with your health care provider. Document Revised: 03/27/2020 Document Reviewed: 03/27/2020 Elsevier Patient Education  2023 Elsevier Inc.  

## 2022-09-09 NOTE — Progress Notes (Unsigned)
Subjective:  Patient ID: Lisa Montoya, female    DOB: [birth year]  Age: 67 y.o. MRN: [id number]  Chief Complaint  Patient presents with   Bladder training    HPI   Patient is here to discuss an order the use of adult urinary incontinence supplies. Pt has intellectual disabilities, non-verbal. She is a resident in local adult group home. She is accompanied by a group home employee who states pt  is in a bladder training program. Group home staff member states pt has occasional urinary incontinence. Pt attends adult day program that does not provide incontinent care with diapers but adult pull-ups are acceptable.  Current Outpatient Medications on File Prior to Visit  Medication Sig Dispense Refill   aspirin EC 81 MG tablet Take 81 mg by mouth daily.     atorvastatin (LIPITOR) 10 MG tablet Take 1 tablet (10 mg total) by mouth daily. 30 tablet 11   bisoprolol-hydrochlorothiazide (ZIAC) 10-6.25 MG tablet Take 0.5 tablets by mouth daily. 15 tablet 11   chlorhexidine (PERIDEX) 0.12 % solution SWISH & SPIT 1/2 OZ BY MOUTH FOR 30 SECONDS TWICE A DAY 473 mL 11   ciclopirox (PENLAC) 8 % solution APPLY OVER NAIL AND SURROUNDING SKIN DAILY AT BEDTIME. APPLY OVER PREVIOUS COATS. AFTER 7 DAYS, REMOVE WITH ALCOHOL AND CONTINUE CYCLE. 6.6 mL 10   ibuprofen (ADVIL) 800 MG tablet Take 800 mg by mouth every 6 (six) hours as needed.     ketoconazole (NIZORAL) 2 % cream Apply 1 Application topically daily. 60 g 2   neomycin-polymyxin b-dexamethasone (MAXITROL) 3.5-10000-0.1 SUSP Place 2 drops into the right eye every 6 (six) hours. 5 mL 0   timolol (TIMOPTIC) 0.25 % ophthalmic solution SMARTSIG:In Eye(s)     No current facility-administered medications on file prior to visit.   Past Medical History:  Diagnosis Date   Essential hypertension    Mental disability    Mixed hyperlipidemia    Morbid obesity due to excess calories ([place])    Pneumonia due to SARS-associated coronavirus    History reviewed.  No pertinent surgical history.  Family History  Problem Relation Age of Onset   Breast cancer Neg Hx    Social History   Socioeconomic History   Marital status: Single    Spouse name: Not on file   Number of children: Not on file   Years of education: Not on file   Highest education level: Not on file  Occupational History   Not on file  Tobacco Use   Smoking status: Never   Smokeless tobacco: Never  Substance and Sexual Activity   Alcohol use: Never   Drug use: Never   Sexual activity: Not Currently  Other Topics Concern   Not on file  Social History Narrative   Not on file   Social Determinants of Health   Financial Resource Strain: Low Risk  (11/22/2021)   Overall Financial Resource Strain (CARDIA)    Difficulty of Paying Living Expenses: Not hard at all  Food Insecurity: No Food Insecurity (11/22/2021)   Hunger Vital Sign    Worried About Running Out of Food in the Last Year: Never true    [place] in the Last Year: Never true  Transportation Needs: No Transportation Needs (11/22/2021)   PRAPARE - Hydrologist (Medical): No    Lack of Transportation (Non-Medical): No  Physical Activity: Inactive (11/22/2021)   Exercise Vital Sign    Days of Exercise per Week:  0 days    Minutes of Exercise per Session: 0 min  Stress: No Stress Concern Present (11/22/2021)   [place]    Feeling of Stress : Not at all  Social Connections: Socially Isolated (11/22/2021)   Social Connection and Isolation Panel [NHANES]    Frequency of Communication with Friends and Family: Never    Frequency of Social Gatherings with Friends and Family: Once a week    Attends Religious Services: Never    Marine scientist or Organizations: No    Attends Music therapist: Never    Marital Status: Never married    Review of Systems  Unable to perform ROS: Patient nonverbal      Objective:  BP 130/80   Pulse 68   Temp (!) 97 F (36.1 C)   Resp 14   Ht 5\' 2"  (1.575 m)   Wt 206 lb (93.4 kg)   SpO2 98%   BMI 37.68 kg/m      09/09/2022    9:31 AM 08/01/2022    9:21 AM 07/20/2022    1:32 PM  BP/Weight  Systolic BP 427 062 376  Diastolic BP 80 80 74  Wt. (Lbs) 206 198 203  BMI 37.68 kg/m2 36.21 kg/m2 37.13 kg/m2    Physical Exam Vitals reviewed.  Skin:    General: Skin is warm.     Capillary Refill: Capillary refill takes less than 2 seconds.  Neurological:     Mental Status: She is alert. Mental status is at baseline.  Psychiatric:        Behavior: Behavior normal.       Lab Results  Component Value Date   WBC 6.2 02/01/2022   HGB 14.3 02/01/2022   HCT 42.4 02/01/2022   PLT 210 02/01/2022   GLUCOSE 98 02/01/2022   CHOL 154 02/01/2022   TRIG 69 02/01/2022   HDL 67 02/01/2022   LDLCALC 73 02/01/2022   ALT 20 02/01/2022   AST 23 02/01/2022   NA 143 02/01/2022   K 4.1 02/01/2022   CL 105 02/01/2022   CREATININE 0.79 02/01/2022   BUN 14 02/01/2022   CO2 23 02/01/2022   TSH 1.620 02/01/2022      Assessment & Plan:   1. Urinary incontinence, unspecified type - Incontinence Supplies KIT; 1 each by Does not apply route daily. Patient needs 3x urinary incontinence supplies  Dispense: 1 kit; Refill: 11  2. Special needs due to language barrier -continue to assist with ADLs as needed   Follow-up: PRN  An After Visit Summary was printed and given to the patient.  I, Rip Harbour, NP, have reviewed all documentation for this visit. The documentation on 09/11/22 for the exam, diagnosis, procedures, and orders are all accurate and complete.   Rip Harbour, NP [place] [phone]

## 2022-09-27 ENCOUNTER — Ambulatory Visit (INDEPENDENT_AMBULATORY_CARE_PROVIDER_SITE_OTHER): Payer: Medicare Other | Admitting: Podiatry

## 2022-09-27 DIAGNOSIS — Z91199 Patient's noncompliance with other medical treatment and regimen due to unspecified reason: Secondary | ICD-10-CM

## 2022-09-27 NOTE — Progress Notes (Signed)
Pt was a no show for apt, charge generated 

## 2022-11-11 ENCOUNTER — Other Ambulatory Visit: Payer: Self-pay | Admitting: Family Medicine

## 2022-11-11 ENCOUNTER — Other Ambulatory Visit: Payer: Self-pay

## 2022-11-11 MED ORDER — KETOCONAZOLE 2 % EX CREA: 1.0000 | TOPICAL_CREAM | Freq: Every day | CUTANEOUS | 2 refills | Status: DC

## 2022-12-12 ENCOUNTER — Ambulatory Visit: Payer: Medicare Other | Admitting: Physician Assistant

## 2023-01-07 ENCOUNTER — Other Ambulatory Visit: Payer: Self-pay | Admitting: Family Medicine

## 2023-01-10 ENCOUNTER — Other Ambulatory Visit: Payer: Self-pay

## 2023-01-10 MED ORDER — BISOPROLOL-HYDROCHLOROTHIAZIDE 10-6.25 MG PO TABS: 0.5000 | ORAL_TABLET | Freq: Every day | ORAL | 2 refills | Status: DC

## 2023-01-10 MED ORDER — ATORVASTATIN CALCIUM 10 MG PO TABS: 10.0000 mg | ORAL_TABLET | Freq: Every day | ORAL | 2 refills | Status: DC

## 2023-01-16 ENCOUNTER — Other Ambulatory Visit: Payer: Self-pay

## 2023-01-16 MED ORDER — CHLORHEXIDINE GLUCONATE 0.12 % MT SOLN: OROMUCOSAL | 11 refills | Status: DC

## 2023-02-05 NOTE — Assessment & Plan Note (Signed)
Stable. Stays in adult group home. 

## 2023-02-05 NOTE — Assessment & Plan Note (Signed)
Well controlled.  No changes to medicines. aking Bisoprolol-Hctz  10-6.25 mg 1/2 tablet daily, aspirin 81 mg daily. Continue to work on eating a healthy diet and exercise.  Labs drawn today.   

## 2023-02-05 NOTE — Progress Notes (Unsigned)
Subjective:   Lisa Montoya is a 67 y.o. female who presents for Medicare Annual (Subsequent) preventive examination.   Hypertension: She is taking Bisoprolol-Hctz  10-6.25 mg 1/2 tablet daily, aspirin 81 mg daily.  Hyperlipidemia: Taking Atorvastatin 10 mg daily and aspirin 81 mg daily. .  Review of Systems    ROS        Objective:    There were no vitals filed for this visit. There is no height or weight on file to calculate BMI.     11/22/2021    2:05 PM 06/14/2019    6:30 AM  Advanced Directives  Does Patient Have a Medical Advance Directive? Yes Unable to assess, patient is non-responsive or altered mental status    Current Medications (verified) Outpatient Encounter Medications as of 02/06/2023  Medication Sig   aspirin EC 81 MG tablet Take 81 mg by mouth daily.   atorvastatin (LIPITOR) 10 MG tablet Take 1 tablet (10 mg total) by mouth daily.   bisoprolol-hydrochlorothiazide (ZIAC) 10-6.25 MG tablet Take 0.5 tablets by mouth daily.   chlorhexidine (PERIDEX) 0.12 % solution SWISH & SPIT 1/2 OZ BY MOUTH FOR 30 SECONDS TWICE A DAY   ciclopirox (PENLAC) 8 % solution APPLY OVER NAIL AND SURROUNDING SKIN DAILY AT BEDTIME. APPLY OVER PREVIOUS COATS. AFTER 7 DAYS, REMOVE WITH ALCOHOL AND CONTINUE CYCLE.   ibuprofen (ADVIL) 800 MG tablet Take 800 mg by mouth every 6 (six) hours as needed.   Incontinence Supplies KIT 1 each by Does not apply route daily. Patient needs 3x urinary incontinence supplies   ketoconazole (NIZORAL) 2 % cream Apply 1 Application topically daily.   neomycin-polymyxin b-dexamethasone (MAXITROL) 3.5-10000-0.1 SUSP Place 2 drops into the right eye every 6 (six) hours.   timolol (TIMOPTIC) 0.25 % ophthalmic solution SMARTSIG:In Eye(s)   No facility-administered encounter medications on file as of 02/06/2023.    Allergies (verified) Patient has no known allergies.   History: Past Medical History:  Diagnosis Date   Essential hypertension    Mental  disability    Mixed hyperlipidemia    Morbid obesity due to excess calories ([facility])    Pneumonia due to SARS-associated coronavirus    No past surgical history on file. Family History  Problem Relation Age of Onset   Breast cancer Neg Hx    Social History   Socioeconomic History   Marital status: Single    Spouse name: Not on file   Number of children: Not on file   Years of education: Not on file   Highest education level: Not on file  Occupational History   Not on file  Tobacco Use   Smoking status: Never   Smokeless tobacco: Never  Substance and Sexual Activity   Alcohol use: Never   Drug use: Never   Sexual activity: Not Currently  Other Topics Concern   Not on file  Social History Narrative   Not on file   Social Determinants of Health   Financial Resource Strain: Low Risk  (11/22/2021)   Overall Financial Resource Strain (CARDIA)    Difficulty of Paying Living Expenses: Not hard at all  Food Insecurity: No Food Insecurity (11/22/2021)   Hunger Vital Sign    Worried About Running Out of Food in the Last Year: Never true    Ran Out of Food in the Last Year: Never true  Transportation Needs: No Transportation Needs (11/22/2021)   PRAPARE - Transportation    Lack of Transportation (Medical): No    Lack of  Transportation (Non-Medical): No  Physical Activity: Inactive (11/22/2021)   Exercise Vital Sign    Days of Exercise per Week: 0 days    Minutes of Exercise per Session: 0 min  Stress: No Stress Concern Present (11/22/2021)   [employer] of Occupational Health - Occupational Stress Questionnaire    Feeling of Stress : Not at all  Social Connections: Socially Isolated (11/22/2021)   Social Connection and Isolation Panel [NHANES]    Frequency of Communication with Friends and Family: Never    Frequency of Social Gatherings with Friends and Family: Once a week    Attends Religious Services: Never    Database administrator or Organizations: No    Attends Tax inspector Meetings: Never    Marital Status: Never married    Tobacco Counseling Counseling given: Not Answered   Clinical Intake:                 Diabetic?***         Activities of Daily Living     No data to display           Patient Care Team: Blane Ohara, MD as PCP - General (Family Medicine)  Indicate any recent Medical Services you may have received from other than [facility] providers in the past year (date may be approximate).     Assessment:   This is a routine wellness examination for Lisa Montoya.  Hearing/Vision screen No results found.  Dietary issues and exercise activities discussed:     Goals Addressed   None   Depression Screen    01/26/2022    9:13 AM 11/22/2021    2:00 PM 11/11/2021   11:09 AM 11/19/2020    9:43 AM  PHQ 2/9 Scores  PHQ - 2 Score 0 0 0 0    Fall Risk    01/26/2022    9:13 AM 11/22/2021    2:00 PM 11/11/2021   11:09 AM 05/11/2021    2:23 PM 01/21/2021    7:45 AM  Fall Risk   Falls in the past year? 0 0 0 0 1  Number falls in past yr: 0 0 0 0 1  Injury with Fall? 0 0 0 0 1  Risk for fall due to : No Fall Risks No Fall Risks  No Fall Risks History of fall(s);Impaired balance/gait;Impaired mobility  Follow up Falls evaluation completed Falls evaluation completed  Falls evaluation completed     FALL RISK PREVENTION PERTAINING TO THE HOME:  Any stairs in or around the home? {YES/NO:21197} If so, are there any without handrails? {YES/NO:21197} Home free of loose throw rugs in walkways, pet beds, electrical cords, etc? {YES/NO:21197} Adequate lighting in your home to reduce risk of falls? {YES/NO:21197}  ASSISTIVE DEVICES UTILIZED TO PREVENT FALLS:  Life alert? {YES/NO:21197} Use of a cane, walker or w/c? {YES/NO:21197} Grab bars in the bathroom? {YES/NO:21197} Shower chair or bench in shower? {YES/NO:21197} Elevated toilet seat or a handicapped toilet? {YES/NO:21197}  TIMED UP AND GO:  Was the test performed?  {YES/NO:21197}.  Length of time to ambulate 10 feet: *** sec.   {Appearance of AOZH:0865784}  Cognitive Function:        Immunizations Immunization History  Administered Date(s) Administered   Fluad Quad(high Dose 65+) 07/07/2022   Influenza Inj Mdck Quad Pf 06/24/2020, 06/30/2021   Influenza, High Dose Seasonal PF 07/09/2019   Influenza-Unspecified 07/19/2016, 06/13/2017, 08/06/2018   Moderna Covid-19 Vaccine Bivalent Booster 53yrs & up 06/30/2021   Moderna SARS-COV2 Booster Vaccination  09/15/2020   Moderna Sars-Covid-2 Vaccination 04/06/2020, 05/04/2020   PNEUMOCOCCAL CONJUGATE-20 02/01/2022   PPD Test 09/26/2012, 01/03/2014, 01/18/2016   Tdap 09/26/2012    {TDAP status:2101805}  {Flu Vaccine status:2101806}  {Pneumococcal vaccine status:2101807}  {Covid-19 vaccine status:2101808}  Qualifies for Shingles Vaccine? {YES/NO:21197}  Zostavax completed {YES/NO:21197}  {Shingrix Completed?:2101804}  Screening Tests Health Maintenance  Topic Date Due   Hepatitis C Screening  Never done   Zoster Vaccines- Shingrix (1 of 2) Never done   Colonoscopy  04/12/2021   DEXA SCAN  Never done   COVID-19 Vaccine (5 - 2023-24 season) 05/06/2022   DTaP/Tdap/Td (2 - Td or Tdap) 09/26/2022   INFLUENZA VACCINE  04/06/2023   MAMMOGRAM  12/30/2023   Medicare Annual Wellness (AWV)  02/06/2024   Pneumonia Vaccine 2+ Years old  Completed   HPV VACCINES  Aged Out    Health Maintenance  Health Maintenance Due  Topic Date Due   Hepatitis C Screening  Never done   Zoster Vaccines- Shingrix (1 of 2) Never done   Colonoscopy  04/12/2021   DEXA SCAN  Never done   COVID-19 Vaccine (5 - 2023-24 season) 05/06/2022   DTaP/Tdap/Td (2 - Td or Tdap) 09/26/2022    {Colorectal cancer screening:2101809}  {Mammogram status:21018020}  {Bone Density status:21018021}  Lung Cancer Screening: (Low Dose CT Chest recommended if Age 55-80 years, 30 pack-year currently smoking OR have quit w/in  15years.) {DOES NOT does:27190::"does not"} qualify.   Lung Cancer Screening Referral: ***  Additional Screening:  Hepatitis C Screening: {DOES NOT does:27190::"does not"} qualify; Completed ***  Vision Screening: Recommended annual ophthalmology exams for early detection of glaucoma and other disorders of the eye. Is the patient up to date with their annual eye exam?  {YES/NO:21197} Who is the provider or what is the name of the office in which the patient attends annual eye exams? *** If pt is not established with a provider, would they like to be referred to a provider to establish care? {YES/NO:21197}.   Dental Screening: Recommended annual dental exams for proper oral hygiene  Community Resource Referral / Chronic Care Management: CRR required this visit?  {YES/NO:21197}  CCM required this visit?  {YES/NO:21197}     Plan:     I have personally reviewed and noted the following in the patient's chart:   Medical and social history Use of alcohol, tobacco or illicit drugs  Current medications and supplements including opioid prescriptions. {Opioid Prescriptions:[id number]} Functional ability and status Nutritional status Physical activity Advanced directives List of other physicians Hospitalizations, surgeries, and ER visits in previous 12 months Vitals Screenings to include cognitive, depression, and falls Referrals and appointments  In addition, I have reviewed and discussed with patient certain preventive protocols, quality metrics, and best practice recommendations. A written personalized care plan for preventive services as well as general preventive health recommendations were provided to patient.     Eugenie Norrie, CMA   02/05/2023   Nurse Notes: ***

## 2023-02-05 NOTE — Assessment & Plan Note (Signed)
Well controlled.  No changes to medicines. Taking Atorvastatin 10 mg daily. Continue to work on eating a healthy diet and exercise.  Labs drawn today.   

## 2023-02-05 NOTE — Patient Instructions (Signed)

## 2023-02-06 ENCOUNTER — Encounter: Payer: Medicare Other | Admitting: Family Medicine

## 2023-02-06 DIAGNOSIS — E782 Mixed hyperlipidemia: Secondary | ICD-10-CM

## 2023-02-06 DIAGNOSIS — I10 Essential (primary) hypertension: Secondary | ICD-10-CM

## 2023-02-06 DIAGNOSIS — Z0000 Encounter for general adult medical examination without abnormal findings: Secondary | ICD-10-CM

## 2023-02-06 DIAGNOSIS — F79 Unspecified intellectual disabilities: Secondary | ICD-10-CM

## 2023-02-07 NOTE — Progress Notes (Signed)
Error. No showed.

## 2023-02-28 ENCOUNTER — Encounter: Payer: Self-pay | Admitting: Family Medicine

## 2023-02-28 ENCOUNTER — Ambulatory Visit (INDEPENDENT_AMBULATORY_CARE_PROVIDER_SITE_OTHER): Payer: Medicare Other | Admitting: Family Medicine

## 2023-02-28 VITALS — BP 118/80 | HR 87 | Temp 97.5°F | Resp 16 | Ht 62.0 in | Wt 207.0 lb

## 2023-02-28 DIAGNOSIS — Z0000 Encounter for general adult medical examination without abnormal findings: Secondary | ICD-10-CM | POA: Diagnosis not present

## 2023-02-28 DIAGNOSIS — Z603 Acculturation difficulty: Secondary | ICD-10-CM

## 2023-02-28 DIAGNOSIS — Z1231 Encounter for screening mammogram for malignant neoplasm of breast: Secondary | ICD-10-CM

## 2023-02-28 DIAGNOSIS — F79 Unspecified intellectual disabilities: Secondary | ICD-10-CM

## 2023-02-28 DIAGNOSIS — Z13820 Encounter for screening for osteoporosis: Secondary | ICD-10-CM | POA: Diagnosis not present

## 2023-02-28 DIAGNOSIS — E782 Mixed hyperlipidemia: Secondary | ICD-10-CM

## 2023-02-28 DIAGNOSIS — Z6836 Body mass index (BMI) 36.0-36.9, adult: Secondary | ICD-10-CM

## 2023-02-28 DIAGNOSIS — Z780 Asymptomatic menopausal state: Secondary | ICD-10-CM

## 2023-02-28 DIAGNOSIS — I10 Essential (primary) hypertension: Secondary | ICD-10-CM

## 2023-02-28 NOTE — Patient Instructions (Signed)
Discuss with Pharmacy about Shingrix, Covid and Tdap vaccines

## 2023-02-28 NOTE — Progress Notes (Signed)
Subjective:   Lisa Montoya is a 67 y.o. female who presents for Medicare Annual (Subsequent) preventive examination.  Patient Medicare AWV questionnaire was completed by the patient on 02/28/23; I have confirmed that all information answered by patient is correct and no changes since this date.  Review of Systems    Review of Systems  Constitutional:  Negative for chills, fever and malaise/fatigue.  HENT:  Negative for ear pain, sinus pain and sore throat.   Respiratory:  Negative for cough and shortness of breath.   Cardiovascular:  Negative for chest pain.  Musculoskeletal:  Negative for myalgias.  Neurological:  Negative for headaches.    Cardiac Risk Factors include: advanced age (>61men, >23 women)     Objective:    Today's Vitals   02/28/23 1531  BP: 118/80  Pulse: 87  Resp: 16  Temp: (!) 97.5 F (36.4 C)  SpO2: 98%  Weight: 207 lb (93.9 kg)  Height: 5\' 2"  (1.575 m)   Body mass index is 37.86 kg/m.     02/28/2023    3:42 PM 11/22/2021    2:05 PM 06/14/2019    6:30 AM  Advanced Directives  Does Patient Have a Medical Advance Directive? Yes Yes Unable to assess, patient is non-responsive or altered mental status  Type of Advance Directive Healthcare Power of [employer] of Healthcare Power of Attorney in Chart? No - copy requested      Current Medications (verified) Outpatient Encounter Medications as of 02/28/2023  Medication Sig   aspirin EC 81 MG tablet Take 81 mg by mouth daily.   atorvastatin (LIPITOR) 10 MG tablet Take 1 tablet (10 mg total) by mouth daily.   bisoprolol-hydrochlorothiazide (ZIAC) 10-6.25 MG tablet Take 0.5 tablets by mouth daily.   chlorhexidine (PERIDEX) 0.12 % solution SWISH & SPIT 1/2 OZ BY MOUTH FOR 30 SECONDS TWICE A DAY   ciclopirox (PENLAC) 8 % solution APPLY OVER NAIL AND SURROUNDING SKIN DAILY AT BEDTIME. APPLY OVER PREVIOUS COATS. AFTER 7 DAYS, REMOVE WITH ALCOHOL AND CONTINUE CYCLE.   ibuprofen (ADVIL) 800 MG tablet  Take 800 mg by mouth every 6 (six) hours as needed.   Incontinence Supplies KIT 1 each by Does not apply route daily. Patient needs 3x urinary incontinence supplies   ketoconazole (NIZORAL) 2 % cream Apply 1 Application topically daily.   neomycin-polymyxin b-dexamethasone (MAXITROL) 3.5-10000-0.1 SUSP Place 2 drops into the right eye every 6 (six) hours.   timolol (TIMOPTIC) 0.25 % ophthalmic solution SMARTSIG:In Eye(s)   No facility-administered encounter medications on file as of 02/28/2023.    Allergies (verified) Patient has no known allergies.   History: Past Medical History:  Diagnosis Date   Essential hypertension    Mental disability    Mixed hyperlipidemia    Morbid obesity due to excess calories (HCC)    Pneumonia due to SARS-associated coronavirus    History reviewed. No pertinent surgical history. Family History  Problem Relation Age of Onset   Breast cancer Neg Hx    Social History   Socioeconomic History   Marital status: Single    Spouse name: Not on file   Number of children: Not on file   Years of education: Not on file   Highest education level: Not on file  Occupational History   Not on file  Tobacco Use   Smoking status: Never   Smokeless tobacco: Never  Substance and Sexual Activity   Alcohol use: Never   Drug use: Never  Sexual activity: Not Currently  Other Topics Concern   Not on file  Social History Narrative   Not on file   Social Determinants of Health   Financial Resource Strain: Low Risk  (02/28/2023)   Overall Financial Resource Strain (CARDIA)    Difficulty of Paying Living Expenses: Not hard at all  Food Insecurity: No Food Insecurity (02/28/2023)   Hunger Vital Sign    Worried About Running Out of Food in the Last Year: Never true    Ran Out of Food in the Last Year: Never true  Transportation Needs: No Transportation Needs (02/28/2023)   PRAPARE - Administrator, Civil Service (Medical): No    Lack of  Transportation (Non-Medical): No  Physical Activity: Insufficiently Active (02/28/2023)   Exercise Vital Sign    Days of Exercise per Week: 3 days    Minutes of Exercise per Session: 30 min  Stress: No Stress Concern Present (02/28/2023)   [employer] of Occupational Health - Occupational Stress Questionnaire    Feeling of Stress : Not at all  Social Connections: Socially Isolated (02/28/2023)   Social Connection and Isolation Panel [NHANES]    Frequency of Communication with Friends and Family: Never    Frequency of Social Gatherings with Friends and Family: Once a week    Attends Religious Services: Never    Database administrator or Organizations: No    Attends Engineer, structural: Never    Marital Status: Never married    Tobacco Counseling Counseling given: Not Answered   Clinical Intake:  Pre-visit preparation completed: No  Pain : No/denies pain     Nutritional Status: BMI > 30  Obese Nutritional Risks: Non-healing wound Diabetes: No  Physical Exam Vitals reviewed.  Constitutional:      General: She is not in acute distress.    Appearance: Normal appearance. She is obese.  HENT:     Right Ear: Tympanic membrane, ear canal and external ear normal.     Left Ear: Tympanic membrane, ear canal and external ear normal.     Nose: Nose normal. No congestion or rhinorrhea.     Mouth/Throat:     Pharynx: No oropharyngeal exudate or posterior oropharyngeal erythema.  Eyes:     Conjunctiva/sclera: Conjunctivae normal.  Neck:     Thyroid: No thyroid mass.     Vascular: No carotid bruit.  Cardiovascular:     Rate and Rhythm: Normal rate and regular rhythm.     Pulses: Normal pulses.     Heart sounds: Normal heart sounds. No murmur heard. Pulmonary:     Effort: Pulmonary effort is normal.     Breath sounds: Normal breath sounds.  Abdominal:     General: Bowel sounds are normal.     Palpations: Abdomen is soft. There is no mass.     Tenderness: There is  no abdominal tenderness.  Musculoskeletal:        General: Normal range of motion.  Lymphadenopathy:     Cervical: No cervical adenopathy.  Skin:    General: Skin is warm and dry.  Neurological:     Mental Status: She is alert.     Cranial Nerves: No cranial nerve deficit.     Comments: Minimally verbal. Diminished IQ.  Psychiatric:        Mood and Affect: Mood normal.        Behavior: Behavior normal.     Activities of Daily Living    02/28/2023    3:44  PM  In your present state of health, do you have any difficulty performing the following activities:  Hearing? 0  Vision? 0  Difficulty concentrating or making decisions? 1  Walking or climbing stairs? 0  Dressing or bathing? 1  Doing errands, shopping? 1  Preparing Food and eating ? Y  Using the Toilet? N  In the past six months, have you accidently leaked urine? Y  Do you have problems with loss of bowel control? Y  Managing your Medications? Y  Managing your Finances? Y  Housekeeping or managing your Housekeeping? Y    Patient Care Team: Blane Ohara, MD as PCP - General (Family Medicine)     Assessment:   This is a routine wellness examination for Antwonette.  Hearing/Vision screen No results found.  Dietary issues and exercise activities discussed:     Goals Addressed   None   Depression Screen    02/28/2023    3:50 PM 01/26/2022    9:13 AM 11/22/2021    2:00 PM 11/11/2021   11:09 AM 11/19/2020    9:43 AM  PHQ 2/9 Scores  PHQ - 2 Score 0 0 0 0 0  Exception Documentation Other- indicate reason in comment box        Fall Risk    02/28/2023    3:44 PM 01/26/2022    9:13 AM 11/22/2021    2:00 PM 11/11/2021   11:09 AM 05/11/2021    2:23 PM  Fall Risk   Falls in the past year? 1 0 0 0 0  Number falls in past yr: 1 0 0 0 0  Injury with Fall? 0 0 0 0 0  Risk for fall due to : No Fall Risks No Fall Risks No Fall Risks  No Fall Risks  Follow up Falls evaluation completed Falls evaluation completed Falls  evaluation completed  Falls evaluation completed    MEDICARE RISK AT HOME:   TIMED UP AND GO:  Was the test performed?  Yes  Length of time to ambulate 10 feet: 10 sec Gait slow and steady without use of assistive device    Cognitive Function:    02/28/2023    3:48 PM  MMSE - Mini Mental State Exam  Not completed: Unable to complete        Immunizations Immunization History  Administered Date(s) Administered   Fluad Quad(high Dose 65+) 07/07/2022   Influenza Inj Mdck Quad Pf 06/24/2020, 06/30/2021   Influenza, High Dose Seasonal PF 07/09/2019   Influenza-Unspecified 07/19/2016, 06/13/2017, 08/06/2018   Moderna Covid-19 Vaccine Bivalent Booster 72yrs & up 06/30/2021   Moderna SARS-COV2 Booster Vaccination 09/15/2020   Moderna Sars-Covid-2 Vaccination 04/06/2020, 05/04/2020   PNEUMOCOCCAL CONJUGATE-20 02/01/2022   PPD Test 09/26/2012, 01/03/2014, 01/18/2016   Tdap 09/26/2012    TDAP status: Due, Education has been provided regarding the importance of this vaccine. Advised may receive this vaccine at local pharmacy or Health Dept. Aware to provide a copy of the vaccination record if obtained from local pharmacy or Health Dept. Verbalized acceptance and understanding.  Flu Vaccine status: Up to date  Pneumococcal vaccine status: Up to date  Covid-19 vaccine status: Information provided on how to obtain vaccines.   Qualifies for Shingles Vaccine? Yes   Zostavax completed No   Shingrix Completed?: No.    Education has been provided regarding the importance of this vaccine. Patient has been advised to call insurance company to determine out of pocket expense if they have not yet received this vaccine. Advised may  also receive vaccine at local pharmacy or Health Dept. Verbalized acceptance and understanding.  Screening Tests Health Maintenance  Topic Date Due   Hepatitis C Screening  Never done   Zoster Vaccines- Shingrix (1 of 2) Never done   DEXA SCAN  Never done    COVID-19 Vaccine (5 - 2023-24 season) 05/06/2022   DTaP/Tdap/Td (2 - Td or Tdap) 09/26/2022   Colonoscopy  02/28/2024 (Originally 04/12/2021)   INFLUENZA VACCINE  04/06/2023   MAMMOGRAM  12/30/2023   Medicare Annual Wellness (AWV)  03/03/2024   Pneumonia Vaccine 14+ Years old  Completed   HPV VACCINES  Aged Out    Health Maintenance  Health Maintenance Due  Topic Date Due   Hepatitis C Screening  Never done   Zoster Vaccines- Shingrix (1 of 2) Never done   DEXA SCAN  Never done   COVID-19 Vaccine (5 - 2023-24 season) 05/06/2022   DTaP/Tdap/Td (2 - Td or Tdap) 09/26/2022    Colorectal cancer screening: Type of screening: Colonoscopy. Completed 04/13/2011. Repeat every 10 years declined to schedule. Patient will not complete cologuard.  Mammogram status: Completed 12/29/2021. Repeat every year will schedule  Bone Density status: Ordered today. Pt provided with contact info and advised to call to schedule appt.  Lung Cancer Screening: (Low Dose CT Chest recommended if Age 25-80 years, 20 pack-year currently smoking OR have quit w/in 15years.) does not qualify.   Lung Cancer Screening Referral: No  Additional Screening:  Hepatitis C Screening: does not qualify;   Vision Screening: Recommended annual ophthalmology exams for early detection of glaucoma and other disorders of the eye. Is the patient up to date with their annual eye exam?  Yes  Who is the provider or what is the name of the office in which the patient attends annual eye exams? Dr. Precious Bard If pt is not established with a provider, would they like to be referred to a provider to establish care? No .   Dental Screening: Recommended annual dental exams for proper oral hygiene   Community Resource Referral / Chronic Care Management: CRR required this visit?  No   CCM required this visit?  No     Plan:     I have personally reviewed and noted the following in the patient's chart:   Medical and social  history Use of alcohol, tobacco or illicit drugs  Current medications and supplements including opioid prescriptions. Patient is not currently taking opioid prescriptions. Functional ability and status Nutritional status Physical activity Advanced directives List of other physicians Hospitalizations, surgeries, and ER visits in previous 12 months Vitals Screenings to include cognitive, depression, and falls Referrals and appointments  In addition, I have reviewed and discussed with patient certain preventive protocols, quality metrics, and best practice recommendations. A written personalized care plan for preventive services as well as general preventive health recommendations were provided to patient.    Clayborn Bigness I Leal-Borjas,acting as a scribe for Blane Ohara, MD.,have documented all relevant documentation on the behalf of Blane Ohara, MD,as directed by  Blane Ohara, MD while in the presence of Blane Ohara, MD.   I attest that I have reviewed this visit and agree with the plan scribed by my staff.   Blane Ohara, MD [facility] [phone]

## 2023-03-03 ENCOUNTER — Other Ambulatory Visit: Payer: Self-pay | Admitting: Family Medicine

## 2023-03-04 ENCOUNTER — Encounter: Payer: Self-pay | Admitting: Family Medicine

## 2023-03-04 DIAGNOSIS — Z603 Acculturation difficulty: Secondary | ICD-10-CM | POA: Insufficient documentation

## 2023-03-04 DIAGNOSIS — Z0000 Encounter for general adult medical examination without abnormal findings: Secondary | ICD-10-CM | POA: Insufficient documentation

## 2023-03-04 NOTE — Assessment & Plan Note (Signed)
Recommend continue to work on eating healthy diet and exercise.  

## 2023-03-04 NOTE — Assessment & Plan Note (Signed)
Well controlled.  No changes to medicines. Taking Atorvastatin 10 mg daily. Continue to work on eating a healthy diet and exercise.  Labs ordered today

## 2023-03-04 NOTE — Assessment & Plan Note (Addendum)
Well controlled.  No changes to medicines. Taking Bisoprolol-Hctz  10-6.25 mg 1/2 tablet daily, aspirin 81 mg daily. Continue to work on eating a healthy diet and exercise.  Labs ordered today.

## 2023-03-04 NOTE — Assessment & Plan Note (Signed)
Stable

## 2023-03-04 NOTE — Assessment & Plan Note (Signed)
Stable. Stays in adult group home. 

## 2023-03-04 NOTE — Assessment & Plan Note (Signed)

## 2023-03-08 ENCOUNTER — Telehealth: Payer: Self-pay | Admitting: Family Medicine

## 2023-03-08 NOTE — Telephone Encounter (Signed)
   Lisa Montoya has been scheduled for the following appointment:  WHAT: BONE DENSITY WHERE: [facility] DATE: 04/07/23 TIME: 10:30 AM CHECK-IN  Patient has been made aware.  SPOKE TO [name]

## 2023-03-09 ENCOUNTER — Other Ambulatory Visit: Payer: Self-pay | Admitting: Family Medicine

## 2023-03-13 ENCOUNTER — Telehealth: Payer: Self-pay

## 2023-03-13 NOTE — Telephone Encounter (Signed)
Care giver called requesting refill for Timolol eye drops, and according to patient's encounters in patient's chart it says that it was denied due to it being written by the eye doctor which is who they will need to contact it to have it refilled. Patient's care giver informed.

## 2023-03-14 ENCOUNTER — Ambulatory Visit (INDEPENDENT_AMBULATORY_CARE_PROVIDER_SITE_OTHER): Payer: Medicare Other

## 2023-03-14 DIAGNOSIS — H6123 Impacted cerumen, bilateral: Secondary | ICD-10-CM | POA: Diagnosis not present

## 2023-03-14 DIAGNOSIS — E782 Mixed hyperlipidemia: Secondary | ICD-10-CM

## 2023-03-14 DIAGNOSIS — I10 Essential (primary) hypertension: Secondary | ICD-10-CM

## 2023-03-14 DIAGNOSIS — Z0000 Encounter for general adult medical examination without abnormal findings: Secondary | ICD-10-CM

## 2023-03-14 NOTE — Progress Notes (Signed)
Ceruminosis is noted bilateral ears.  Wax is removed by syringing and manual debridement. Instructions for home care to prevent wax buildup are given.

## 2023-03-15 ENCOUNTER — Ambulatory Visit
Admission: RE | Admit: 2023-03-15 | Discharge: 2023-03-15 | Disposition: A | Discharge status: Home / Self Care | Payer: Medicare Other | Source: Ambulatory Visit | Attending: Family Medicine | Admitting: Family Medicine

## 2023-03-15 DIAGNOSIS — Z1231 Encounter for screening mammogram for malignant neoplasm of breast: Secondary | ICD-10-CM

## 2023-03-15 DIAGNOSIS — Z0000 Encounter for general adult medical examination without abnormal findings: Secondary | ICD-10-CM

## 2023-03-17 ENCOUNTER — Other Ambulatory Visit: Payer: Self-pay | Admitting: Family Medicine

## 2023-03-17 ENCOUNTER — Other Ambulatory Visit: Payer: Medicare Other

## 2023-03-17 MED ORDER — TIMOLOL MALEATE 0.25 % OP SOLN: 1.0000 [drp] | Freq: Two times a day (BID) | OPHTHALMIC | 2 refills | Status: DC

## 2023-03-18 LAB — CBC WITH DIFFERENTIAL/PLATELET
Basophils Absolute: 0 10*3/uL (ref 0.0–0.2)
Basos: 0 %
EOS (ABSOLUTE): 0.1 10*3/uL (ref 0.0–0.4)
Eos: 2 %
Hematocrit: 39.6 % (ref 34.0–46.6)
Hemoglobin: 13.3 g/dL (ref 11.1–15.9)
Immature Grans (Abs): 0 10*3/uL (ref 0.0–0.1)
Immature Granulocytes: 0 %
Lymphocytes Absolute: 1.2 10*3/uL (ref 0.7–3.1)
Lymphs: 26 %
MCH: 31.4 pg (ref 26.6–33.0)
MCHC: 33.6 g/dL (ref 31.5–35.7)
MCV: 93 fL (ref 79–97)
Monocytes Absolute: 0.4 10*3/uL (ref 0.1–0.9)
Monocytes: 8 %
Neutrophils Absolute: 3 10*3/uL (ref 1.4–7.0)
Neutrophils: 64 %
Platelets: 228 10*3/uL (ref 150–450)
RBC: 4.24 x10E6/uL (ref 3.77–5.28)
RDW: 12.7 % (ref 11.7–15.4)
WBC: 4.6 10*3/uL (ref 3.4–10.8)

## 2023-03-18 LAB — LIPID PANEL
Chol/HDL Ratio: 2.4 ratio (ref 0.0–4.4)
Cholesterol, Total: 153 mg/dL (ref 100–199)
HDL: 64 mg/dL (ref >39)
LDL Chol Calc (NIH): 72 mg/dL (ref 0–99)
Triglycerides: 94 mg/dL (ref 0–149)
VLDL Cholesterol Cal: 17 mg/dL (ref 5–40)

## 2023-03-18 LAB — COMPREHENSIVE METABOLIC PANEL
ALT: 19 IU/L (ref 0–32)
AST: 22 IU/L (ref 0–40)
Albumin: 4.2 g/dL (ref 3.9–4.9)
Alkaline Phosphatase: 102 IU/L (ref 44–121)
BUN/Creatinine Ratio: 11 — ABNORMAL LOW (ref 12–28)
BUN: 8 mg/dL (ref 8–27)
Bilirubin Total: 0.3 mg/dL (ref 0.0–1.2)
CO2: 25 mmol/L (ref 20–29)
Calcium: 9.7 mg/dL (ref 8.7–10.3)
Chloride: 104 mmol/L (ref 96–106)
Creatinine, Ser: 0.74 mg/dL (ref 0.57–1.00)
Globulin, Total: 2.4 g/dL (ref 1.5–4.5)
Glucose: 99 mg/dL (ref 70–99)
Potassium: 4.6 mmol/L (ref 3.5–5.2)
Sodium: 145 mmol/L — ABNORMAL HIGH (ref 134–144)
Total Protein: 6.6 g/dL (ref 6.0–8.5)
eGFR: 89 mL/min/{1.73_m2} (ref >59)

## 2023-03-18 LAB — TSH: TSH: 1.25 u[IU]/mL (ref 0.450–4.500)

## 2023-03-29 ENCOUNTER — Encounter: Payer: Medicare Other | Admitting: Podiatry

## 2023-04-01 NOTE — Progress Notes (Signed)
Patient was a no show for scheduled appt today.

## 2023-04-05 ENCOUNTER — Ambulatory Visit: Payer: Medicare Other | Admitting: Podiatry

## 2023-04-05 DIAGNOSIS — B351 Tinea unguium: Secondary | ICD-10-CM | POA: Diagnosis not present

## 2023-04-05 DIAGNOSIS — L84 Corns and callosities: Secondary | ICD-10-CM | POA: Diagnosis not present

## 2023-04-05 DIAGNOSIS — I739 Peripheral vascular disease, unspecified: Secondary | ICD-10-CM

## 2023-04-05 NOTE — Progress Notes (Signed)
    Subjective:  Patient ID: Lisa Montoya, female    DOB: 06/25/1956,  MRN: [id number]  Lisa Montoya presents to clinic today for:  Chief Complaint  Patient presents with   Nail Problem    Routine Foot Care-nail trim   . Patient notes nails are thick and elongated, causing pain in shoe gear when ambulating.  She has calluses bilateral submet 1.  They are painful.  She resides at a facility and the facility manager brought the patient today.  She is present in the room with her.  PCP is Cox, Kirsten, MD. date last seen was 02/28/2023  No Known Allergies  Review of Systems: Negative except as noted in the HPI.  Objective:  There were no vitals filed for this visit.  Lisa Montoya is a pleasant 67 y.o. female in NAD. AAO x 3.  Vascular Examination: Patient has palpable DP pulse, absent PT pulse bilateral.  Delayed capillary refill bilateral toes.  Sparse digital hair bilateral.  Proximal to distal cooling WNL bilateral.    Dermatological Examination: Interspaces are clear with no open lesions noted bilateral.  Nails are 3-15mm thick, with yellowish/brown discoloration, subungual debris and distal onycholysis x10.  There is pain with compression of nails x10.  There are hyperkeratotic lesions noted bilateral submet 1 with pain on palpation.  Patient qualifies for at-risk foot care because of PVD.  Assessment/Plan: 1. Dermatophytosis of nail   2. Callus   3. PVD (peripheral vascular disease) (HCC)     Mycotic nails x10 were sharply debrided with sterile nail nippers and power debriding burr to decrease bulk and length.  Hyperkeratotic lesions x 2 were shaved with #312 blade.  Return in about 3 months (around 07/06/2023) for RFC.   Clerance Lav, DPM, FACFAS [facility]     [address] [place], [place] [address]                Office [phone]  Fax [phone]

## 2023-04-06 ENCOUNTER — Other Ambulatory Visit: Payer: Self-pay | Admitting: Family Medicine

## 2023-04-07 ENCOUNTER — Encounter: Payer: Self-pay | Admitting: Family Medicine

## 2023-04-07 DIAGNOSIS — M8580 Other specified disorders of bone density and structure, unspecified site: Secondary | ICD-10-CM

## 2023-04-07 LAB — HM DEXA SCAN

## 2023-04-12 MED ORDER — CALTRATE 600+D PLUS MINERALS 600-800 MG-UNIT PO TABS: 1.0000 | ORAL_TABLET | Freq: Two times a day (BID) | ORAL | 1 refills | Status: AC

## 2023-05-15 ENCOUNTER — Telehealth: Payer: Self-pay

## 2023-05-15 NOTE — Telephone Encounter (Signed)
Prior Authorization for ciclopirox 8% was submitted through CoverMyMeds - This likely not a covered medication. Called patient and left a detailed voice message advising to try GoodRx and see if that brings it down to an affordable cost.

## 2023-05-23 ENCOUNTER — Encounter: Payer: Self-pay | Admitting: Family Medicine

## 2023-05-23 ENCOUNTER — Telehealth: Payer: Self-pay

## 2023-05-23 NOTE — Telephone Encounter (Signed)
[employer] and stated that they need a discontinue order for debrox for patient.

## 2023-05-23 NOTE — Telephone Encounter (Signed)
Sent!

## 2023-05-29 ENCOUNTER — Telehealth: Payer: Self-pay

## 2023-05-29 NOTE — Telephone Encounter (Signed)
BYRAM ADULT SZD BRIEF EXTRA LG SZ EA

## 2023-05-31 ENCOUNTER — Telehealth: Payer: Self-pay

## 2023-05-31 ENCOUNTER — Other Ambulatory Visit: Payer: Self-pay | Admitting: Family Medicine

## 2023-05-31 DIAGNOSIS — F79 Unspecified intellectual disabilities: Secondary | ICD-10-CM

## 2023-05-31 DIAGNOSIS — Z603 Acculturation difficulty: Secondary | ICD-10-CM

## 2023-05-31 NOTE — Telephone Encounter (Signed)
[name] from the group home called stating that the patient's therapist stated that the patient needed a referral to have a Psychological evaluation per her insurance and all of the actual group [facility] and that is where the therapist recommended to be referred to. Can you please put this referral in per [facility] from the group home.

## 2023-05-31 NOTE — Telephone Encounter (Signed)
Referral sent 

## 2023-07-05 ENCOUNTER — Encounter: Payer: Self-pay | Admitting: Podiatry

## 2023-07-05 ENCOUNTER — Ambulatory Visit (INDEPENDENT_AMBULATORY_CARE_PROVIDER_SITE_OTHER): Payer: Medicare Other | Admitting: Podiatry

## 2023-07-05 DIAGNOSIS — L84 Corns and callosities: Secondary | ICD-10-CM

## 2023-07-05 DIAGNOSIS — I739 Peripheral vascular disease, unspecified: Secondary | ICD-10-CM

## 2023-07-05 DIAGNOSIS — B351 Tinea unguium: Secondary | ICD-10-CM | POA: Diagnosis not present

## 2023-07-05 NOTE — Progress Notes (Signed)
    Subjective:  Patient ID: Lisa Montoya, female    DOB: 08/02/1956,  MRN: [id number]  Lisa Montoya presents to clinic today for:  Chief Complaint  Patient presents with   RFC    Nails and callus trim  . Patient notes nails are thick and elongated, causing pain in shoe gear when ambulating.  She has painful calluses bilateral submet 1.  There is ciclopirox lacquer buildup on the toenails today.  PCP is Cox, Fritzi Mandes, MD. last seen 02/28/2023.  A caregiver is present today.  Past Medical History:  Diagnosis Date   Essential hypertension    Mental disability    Mixed hyperlipidemia    Morbid obesity due to excess calories (HCC)    Pneumonia due to SARS-associated coronavirus     No Known Allergies  Objective:  Lisa Montoya is a pleasant 67 y.o. female in NAD. AAO x 3.  Vascular Examination: Patient has palpable DP pulse, absent PT pulse bilateral.  Delayed capillary refill bilateral toes.  Sparse digital hair bilateral.  Proximal to distal cooling WNL bilateral.    Dermatological Examination: Interspaces are clear with no open lesions noted bilateral.  Skin is shiny and atrophic bilateral.  Nails are 3-97mm thick, with yellowish/brown discoloration, subungual debris and distal onycholysis x10.  There is pain with compression of nails x10.  There is ciclopirox lacquer buildup on the nails today.  There are hyperkeratotic lesions noted submet 1 bilateral.  Patient qualifies for at-risk foot care because of PVD.  Assessment/Plan: 1. Dermatophytosis of nail   2. Callus   3. PVD (peripheral vascular disease) (HCC)    Mycotic nails x10 were sharply debrided with sterile nail nippers and power debriding burr to decrease bulk and length.  The nails looked much better after burring the ciclopirox lacquer buildup from the nails  Hyperkeratotic lesions x 2 were shaved with #312 blade.  Continue with ciclopirox 8% solution to the toenails once daily.  Continue trying to  remove lacquer buildup every week.  Return in about 3 months (around 10/05/2023) for RFC.   Clerance Lav, DPM, FACFAS [facility]     [address] [place], [place] [address]                Office [phone]  Fax [phone]

## 2023-07-06 ENCOUNTER — Ambulatory Visit (INDEPENDENT_AMBULATORY_CARE_PROVIDER_SITE_OTHER): Payer: Medicare Other

## 2023-07-06 DIAGNOSIS — Z23 Encounter for immunization: Secondary | ICD-10-CM | POA: Diagnosis not present

## 2023-07-11 ENCOUNTER — Other Ambulatory Visit: Payer: Self-pay | Admitting: Family Medicine

## 2023-07-25 ENCOUNTER — Other Ambulatory Visit: Payer: Self-pay | Admitting: Podiatry

## 2023-08-08 ENCOUNTER — Telehealth: Payer: Self-pay

## 2023-08-08 NOTE — Telephone Encounter (Signed)
Please advice  Copied from CRM [id number]. Topic: Clinical - Home Health Verbal Orders >> Aug 08, 2023  7:54 AM Dimitri Ped wrote: Caller/Agency: Marcelino Duster /residential manager Callback Number: [phone] fax number : [phone] Physician order to see if we can put her meds in apple sauce cause she is not taking them  Any new concerns about the patient? Yes harder for patient to take her meds even with one pill its hard for her

## 2023-08-09 ENCOUNTER — Other Ambulatory Visit: Payer: Self-pay

## 2023-08-09 NOTE — Telephone Encounter (Signed)
Done. Sent communication via epic.

## 2023-08-10 ENCOUNTER — Telehealth: Payer: Self-pay

## 2023-08-10 NOTE — Telephone Encounter (Signed)
Letter has been refaxed  Copied from CRM [id number]. Topic: Clinical - Home Health Verbal Orders >> Aug 10, 2023  3:40 PM Fonda Kinder J wrote: Caller/Agency: Caller/Agency: Ronald Lobo Number: [phone] Service Requested: Home Facility Frequency: Daily Any new concerns about the patient? Yes  [facility] says they never received the fax with the order to have patient's medication put in apple sauce

## 2023-08-22 ENCOUNTER — Telehealth: Payer: Self-pay

## 2023-08-22 ENCOUNTER — Encounter: Payer: Self-pay | Admitting: Family Medicine

## 2023-08-22 NOTE — Telephone Encounter (Signed)
Copied from CRM [id number]. Topic: General - Other >> Aug 21, 2023  2:56 PM Almira Coaster wrote: Reason for CRM: Ancil Boozer called to request an order for a wheel chair. Per Marcelino Duster order should state To be used on outings only.

## 2023-08-22 NOTE — Telephone Encounter (Signed)
Order has been faxed to [phone], called and left [facility]

## 2023-09-08 ENCOUNTER — Ambulatory Visit (INDEPENDENT_AMBULATORY_CARE_PROVIDER_SITE_OTHER): Payer: Medicare Other | Admitting: Family Medicine

## 2023-09-08 ENCOUNTER — Encounter: Payer: Self-pay | Admitting: Family Medicine

## 2023-09-08 VITALS — BP 110/80 | HR 66 | Temp 97.5°F | Resp 18 | Ht 62.0 in | Wt 185.0 lb

## 2023-09-08 DIAGNOSIS — E782 Mixed hyperlipidemia: Secondary | ICD-10-CM | POA: Diagnosis not present

## 2023-09-08 DIAGNOSIS — R269 Unspecified abnormalities of gait and mobility: Secondary | ICD-10-CM | POA: Insufficient documentation

## 2023-09-08 DIAGNOSIS — W19XXXA Unspecified fall, initial encounter: Secondary | ICD-10-CM | POA: Diagnosis not present

## 2023-09-08 DIAGNOSIS — I10 Essential (primary) hypertension: Secondary | ICD-10-CM

## 2023-09-08 NOTE — Progress Notes (Signed)
 Subjective:  Patient ID: Lisa Montoya, female    DOB: 1956/06/29  Age: 68 y.o. MRN: [id number]  Discussed the use of AI scribe software for clinical note transcription with the patient, who gave verbal consent to proceed.   Chief Complaint  Patient presents with   Fall    HPI The patient, who resides in a care home, was brought in by her caregiver after a fall. Patient fell last Saturday trying to get her self dressed in the bathroom. Patient caregiver denies patient hitting her head, stated patient didn't get hurt. The patient did not sustain any injuries or bruises from the fall, but the caregiver reports that the patient is frequently off balance. The patient denies any pain in her arms or legs from the fall. The patient has fallen no more than twice in the past six months, but the caregiver notes that the patient's balance is off. The patient is not currently using any mobility aids and is concerned for the safety of the patient. The patient currently has an order for a wheelchair to assist with outings but they have still not been able to find a DME company that takes her insurance.  The patient is also on medication for blood pressure and cholesterol. The patient's blood pressure was measured during the visit and was found to be 110/80. The patient has not complained of any pain or new symptoms. The patient's toenails are being treated by her podiatrist with daily nail lacquer.     02/28/2023    3:50 PM 01/26/2022    9:13 AM 11/22/2021    2:00 PM 11/11/2021   11:09 AM 11/19/2020    9:43 AM  Depression screen PHQ 2/9  Decreased Interest 0 0 0 0 0  Down, Depressed, Hopeless 0 0 0 0 0  PHQ - 2 Score 0 0 0 0 0        09/08/2023    9:40 AM  Fall Risk   Falls in the past year? 1  Number falls in past yr: 0  Injury with Fall? 0  Risk for fall due to : Impaired balance/gait;Impaired mobility  Follow up Falls evaluation completed;Falls prevention discussed    Patient Care Team: CoxAbigail, MD as PCP - General (Family Medicine)   Review of Systems  Constitutional:  Negative for fatigue.  HENT:  Negative for congestion, ear pain and sore throat.   Respiratory:  Negative for cough and shortness of breath.   Cardiovascular:  Negative for chest pain.  Gastrointestinal:  Negative for abdominal pain, constipation, diarrhea, nausea and vomiting.  Genitourinary:  Negative for dysuria, frequency and urgency.  Musculoskeletal:  Negative for arthralgias, back pain and myalgias.  Skin:  Positive for wound (toes).  Neurological:  Negative for dizziness and headaches.  Psychiatric/Behavioral:  Negative for agitation and sleep disturbance. The patient is not nervous/anxious.     Current Outpatient Medications on File Prior to Visit  Medication Sig Dispense Refill   aspirin  EC 81 MG tablet Take 81 mg by mouth daily.     atorvastatin  (LIPITOR) 10 MG tablet TAKE 1 TABLET BY MOUTH ONCE DAILY 30 tablet 11   bisoprolol -hydrochlorothiazide  (ZIAC ) 10-6.25 MG tablet TAKE 1/2 TABLET BY MOUTH ONCE DAILY 15 tablet 11   Calcium  Carbonate-Vit D-Min (CALTRATE 600+D PLUS MINERALS) 600-800 MG-UNIT TABS Take 1 tablet by mouth 2 (two) times daily. 180 tablet 1   chlorhexidine  (PERIDEX ) 0.12 % solution SWISH & SPIT 1/2 OZ BY MOUTH FOR 30 SECONDS TWICE A  DAY 473 mL 11   ciclopirox  (PENLAC ) 8 % solution APPLY OVER NAIL AND SURROUNDING SKIN DAILY AT BEDTIME. APPLY OVER PREVIOUS COATS. AFTER 7 DAYS, REMOVE WITH ALCOHOL  AND CONTINUE CYCLE 6.6 mL 11   ibuprofen (ADVIL) 800 MG tablet Take 800 mg by mouth every 6 (six) hours as needed.     Incontinence Supplies KIT 1 each by Does not apply route daily. Patient needs 3x urinary incontinence supplies 1 kit 11   ketoconazole  (NIZORAL ) 2 % cream APPLY ONE APPLICATION TO THE AFFECTED AREA TOPICALLY ONCE DAILY 30 g 11   neomycin -polymyxin b -dexamethasone  (MAXITROL) 3.5-10000-0.1 SUSP Place 2 drops into the right eye every 6 (six) hours. 5 mL 0   timolol  (TIMOPTIC )  0.25 % ophthalmic solution INSTILL 1 DROP INTO BOTH EYES  TWICE A DAY AS INSTRUCTED 5 mL 2   No current facility-administered medications on file prior to visit.   Past Medical History:  Diagnosis Date   Essential hypertension    Mental disability    Mixed hyperlipidemia    Morbid obesity due to excess calories (HCC)    Pneumonia due to SARS-associated coronavirus    No past surgical history on file.  Family History  Problem Relation Age of Onset   Breast cancer Neg Hx    Social History   Socioeconomic History   Marital status: Single    Spouse name: Not on file   Number of children: Not on file   Years of education: Not on file   Highest education level: Not on file  Occupational History   Not on file  Tobacco Use   Smoking status: Never   Smokeless tobacco: Never  Substance and Sexual Activity   Alcohol  use: Never   Drug use: Never   Sexual activity: Not Currently  Other Topics Concern   Not on file  Social History Narrative   Not on file   Social Drivers of Health   Financial Resource Strain: Low Risk  (02/28/2023)   Overall Financial Resource Strain (CARDIA)    Difficulty of Paying Living Expenses: Not hard at all  Food Insecurity: No Food Insecurity (02/28/2023)   Hunger Vital Sign    Worried About Running Out of Food in the Last Year: Never true    Ran Out of Food in the Last Year: Never true  Transportation Needs: No Transportation Needs (02/28/2023)   PRAPARE - Administrator, Civil Service (Medical): No    Lack of Transportation (Non-Medical): No  Physical Activity: Insufficiently Active (02/28/2023)   Exercise Vital Sign    Days of Exercise per Week: 3 days    Minutes of Exercise per Session: 30 min  Stress: No Stress Concern Present (02/28/2023)   [employer] of Occupational Health - Occupational Stress Questionnaire    Feeling of Stress : Not at all  Social Connections: Socially Isolated (02/28/2023)   Social Connection and Isolation  Panel [NHANES]    Frequency of Communication with Friends and Family: Never    Frequency of Social Gatherings with Friends and Family: Once a week    Attends Religious Services: Never    Database Administrator or Organizations: No    Attends Engineer, Structural: Never    Marital Status: Never married    Objective:  BP 110/80 (BP Location: Right Arm, Patient Position: Sitting, Cuff Size: Large)   Pulse 66   Temp (!) 97.5 F (36.4 C) (Temporal)   Resp 18   Ht 5' 2 (1.575 m)  Wt 185 lb (83.9 kg)   SpO2 97%   BMI 33.84 kg/m      09/08/2023    9:37 AM 02/28/2023    3:31 PM 09/09/2022    9:31 AM  BP/Weight  Systolic BP 110 118 130  Diastolic BP 80 80 80  Wt. (Lbs) 185 207 206  BMI 33.84 kg/m2 37.86 kg/m2 37.68 kg/m2    Physical Exam Vitals reviewed.  Constitutional:      General: She is not in acute distress.    Appearance: Normal appearance. She is not ill-appearing.  HENT:     Head: Normocephalic.     Nose: Nose normal.  Eyes:     Conjunctiva/sclera: Conjunctivae normal.  Cardiovascular:     Rate and Rhythm: Normal rate and regular rhythm.     Heart sounds: Normal heart sounds.  Pulmonary:     Effort: Pulmonary effort is normal.     Breath sounds: Normal breath sounds.  Abdominal:     General: Bowel sounds are normal.     Tenderness: There is no abdominal tenderness.  Musculoskeletal:        General: No tenderness or deformity. Normal range of motion.     Cervical back: Normal range of motion.  Skin:    General: Skin is warm.  Neurological:     Mental Status: She is alert. Mental status is at baseline.     Gait: Gait abnormal.     Deep Tendon Reflexes: Reflexes are normal and symmetric.     Comments: Ambulated with patient for 100 feet and she needed full assistance during the walk.  Psychiatric:        Mood and Affect: Mood normal.      Lab Results  Component Value Date   WBC 4.6 03/17/2023   HGB 13.3 03/17/2023   HCT 39.6 03/17/2023   PLT  228 03/17/2023   GLUCOSE 99 03/17/2023   CHOL 153 03/17/2023   TRIG 94 03/17/2023   HDL 64 03/17/2023   LDLCALC 72 03/17/2023   ALT 19 03/17/2023   AST 22 03/17/2023   NA 145 (H) 03/17/2023   K 4.6 03/17/2023   CL 104 03/17/2023   CREATININE 0.74 03/17/2023   BUN 8 03/17/2023   CO2 25 03/17/2023   TSH 1.250 03/17/2023      Assessment & Plan:    Abnormal gait Assessment & Plan: Patient has had two falls in the past six months and has been noted to have balance issues. No injuries reported from falls. Patient was observed to have an unsteady gait during the visit. -Order Physical Therapy evaluation and treatment to improve balance and mobility. -Prescribe a rolling walker to assist with mobility and balance for the safety of the patient. [employer] [facility] is the preferred DME company that was recommended.  Orders: -     Ambulatory referral to Physical Therapy -     Walker rolling  Fall, initial encounter Assessment & Plan: Patient did not sustain any injuries during this fall. She is currently a high fall risk at the care home in which she resides.  -Orders have been placed for PT and a walker for the safety of the patient due to her abnormal gait and fall history.  Orders: -     Ambulatory referral to Physical Therapy -     Walker rolling  Mixed hyperlipidemia Assessment & Plan: Well controlled.  No changes to medicines. Taking Atorvastatin  10 mg daily. Continue to work on eating a healthy diet and exercise.  FU in February with Dr. Sherre   Essential hypertension Assessment & Plan: Well controlled.  No changes to medicines. Taking Bisoprolol -Hctz  10-6.25 mg 1/2 tablet daily, aspirin  81 mg daily. Continue to work on eating a healthy diet and exercise.  FU scheduled in February with Dr. Sherre      No orders of the defined types were placed in this encounter.   Orders Placed This Encounter  Procedures   Walker rolling   Ambulatory referral to Physical  Therapy     Follow-up: Return for keep appointment in February with Dr. Sherre.   I,Jacqua L Marsh,acting as a scribe for Harrie CHRISTELLA Cedar, FNP.,have documented all relevant documentation on the behalf of Harrie CHRISTELLA Cedar, FNP,as directed by  Harrie CHRISTELLA Cedar, FNP while in the presence of Harrie CHRISTELLA Cedar, FNP.   An After Visit Summary was printed and given to the patient.  Total time spent on today's visit was 35 minutes, including both face-to-face time and nonface-to-face time personally spent on review of chart (labs and imaging), discussing labs and goals, discussing further work-up, treatment options, referrals to specialist if needed, reviewing outside records if pertinent, answering patient's questions, and coordinating care.    Harrie Cedar, FNP [facility] [phone]

## 2023-09-08 NOTE — Assessment & Plan Note (Addendum)
 Patient has had two falls in the past six months and has been noted to have balance issues. No injuries reported from falls. Patient was observed to have an unsteady gait during the visit. -Order Physical Therapy evaluation and treatment to improve balance and mobility. -Prescribe a rolling walker to assist with mobility and balance for the safety of the patient. [facility] is the preferred DME company that was recommended.

## 2023-09-08 NOTE — Assessment & Plan Note (Signed)
 Patient did not sustain any injuries during this fall. She is currently a high fall risk at the care home in which she resides.  -Orders have been placed for PT and a walker for the safety of the patient due to her abnormal gait and fall history.

## 2023-09-08 NOTE — Assessment & Plan Note (Addendum)
 Well controlled.  No changes to medicines. Taking Bisoprolol-Hctz  10-6.25 mg 1/2 tablet daily, aspirin 81 mg daily. Continue to work on eating a healthy diet and exercise.  FU scheduled in February with Dr. Sedalia Muta

## 2023-09-08 NOTE — Assessment & Plan Note (Signed)
 Well controlled.  No changes to medicines. Taking Atorvastatin 10 mg daily. Continue to work on eating a healthy diet and exercise.  FU in February with Dr. Sedalia Muta

## 2023-09-12 ENCOUNTER — Telehealth: Payer: Self-pay

## 2023-09-12 NOTE — Telephone Encounter (Signed)
 Copied from CRM [id number]. Topic: General - Other >> Sep 12, 2023 10:50 AM Tonda B wrote: Reason for CRM: [facility] needs to know how long the patient will be on walker so they patient can go back to [facility] please call [phone] if any questions

## 2023-09-12 NOTE — Telephone Encounter (Signed)
 Copied from CRM [id number]. Topic: Medical Record Request - Other >> Sep 11, 2023 10:45 AM Ivette P wrote: Reason for CRM: Pt called in to notify that [facility] will call in to ask about what walker is needed for and prescription. And [facility] will call to get e note about walker and what walker is needed for. Pt will need a note for [facility]. Requesting call back at [phone].

## 2023-09-13 ENCOUNTER — Encounter: Payer: Self-pay | Admitting: Family Medicine

## 2023-09-13 NOTE — Telephone Encounter (Signed)
 Letter sent.

## 2023-09-26 ENCOUNTER — Other Ambulatory Visit: Payer: Self-pay | Admitting: Family Medicine

## 2023-10-05 ENCOUNTER — Other Ambulatory Visit: Payer: Self-pay | Admitting: Family Medicine

## 2023-10-05 ENCOUNTER — Ambulatory Visit: Payer: Medicare Other | Admitting: Podiatry

## 2023-10-17 ENCOUNTER — Other Ambulatory Visit: Payer: Self-pay | Admitting: Family Medicine

## 2023-11-01 ENCOUNTER — Ambulatory Visit (INDEPENDENT_AMBULATORY_CARE_PROVIDER_SITE_OTHER): Payer: Medicare Other | Admitting: Podiatry

## 2023-11-01 DIAGNOSIS — I739 Peripheral vascular disease, unspecified: Secondary | ICD-10-CM

## 2023-11-01 DIAGNOSIS — B351 Tinea unguium: Secondary | ICD-10-CM

## 2023-11-01 DIAGNOSIS — L853 Xerosis cutis: Secondary | ICD-10-CM

## 2023-11-01 DIAGNOSIS — M79675 Pain in left toe(s): Secondary | ICD-10-CM | POA: Diagnosis not present

## 2023-11-01 DIAGNOSIS — M79674 Pain in right toe(s): Secondary | ICD-10-CM | POA: Diagnosis not present

## 2023-11-01 MED ORDER — UREA 10 % EX CREA: TOPICAL_CREAM | Freq: Every day | CUTANEOUS | 3 refills | Status: AC

## 2023-11-01 NOTE — Progress Notes (Unsigned)
 Subjective:  Patient ID: Lisa Montoya, female    DOB: December 02, 1955,  MRN: [id number]  Lisa Montoya presents to clinic today for:  Chief Complaint  Patient presents with   Nail Problem    Possible RFC with fungal check. Took as much of the lacre off of the left foot so you can see. Worker, Mary < is with her today. They are requesting to have the polish discontinued as it is not being used correctly and is not working.  Not diabetic, no callous, Takes ASA 81   Patient notes nails are thick and elongated, causing pain in shoe gear when ambulating.  The caregivers at her facility were applying ciclopirox to the toenails but states that it is becoming a "mass" relying on the staff to put this on appropriately without applying too much solution.  And notes that there is still flaking and thick skin on the plantar aspect of both feet.  PCP is Cox, Fritzi Mandes, MD. last seen around 09/08/2023  Past Medical History:  Diagnosis Date   Essential hypertension    Mental disability    Mixed hyperlipidemia    Morbid obesity due to excess calories (HCC)    Pneumonia due to SARS-associated coronavirus    No Known Allergies  Objective:  Lisa Montoya is a pleasant 68 y.o. female in NAD. AAO x 3.  Vascular Examination: Patient has palpable DP pulse, absent PT pulse bilateral.  Delayed capillary refill bilateral toes.  Sparse digital hair bilateral.  Proximal to distal cooling WNL bilateral.    Dermatological Examination: Interspaces are clear with no open lesions noted bilateral.  Skin is shiny and atrophic bilateral.  Nails are 3-53mm thick, with yellowish/brown discoloration, subungual debris and distal onycholysis x10.  There is pain with compression of nails x10.  There is dry skin and flaky, hard skin buildup on the plantar aspect of both feet but no specific callus formation today.  Patient qualifies for at-risk foot care because of PVD.  Assessment/Plan: 1. Pain due to onychomycosis  of toenails of both feet   2. PVD (peripheral vascular disease) (HCC)     Meds ordered this encounter  Medications   urea (CARMOL) 10 % cream    Sig: Apply topically at bedtime. Apply thin layer to skin on bottom of both feet at bedtime.  Standing order until patient re-evaluated in 3 months.    Dispense:  71 g    Refill:  3    I believe this gets delivered to the patient's facility.  Thank you   Mycotic nails x10 were sharply debrided with sterile nail nippers and power debriding burr to decrease bulk and length.  The nails appeared significantly better showing improvement of the onychomycosis following debridement of the nail lacquer.  However we will proceed with discontinuance of the ciclopirox due to difficulties with staff application.  Will discontinue the ciclopirox at this time.  Will send in new prescription for Carmol 10 to be applied once daily to the skin on the plantar aspect of both feet.  Addendum: Received a call the next day from the facility questioning her orders on the previously prescribed ketoconazole.  They needed new orders discontinuing this as well.  They will stop by with a written order sheet so that we can formally discontinue the ketoconazole application.   Return in about 3 months (around 01/29/2024) for RFC.   Clerance Lav, DPM, FACFAS [facility]  [address] [place], [place] [address]                Office [phone]  Fax [phone]

## 2023-11-10 ENCOUNTER — Other Ambulatory Visit: Payer: Self-pay

## 2023-11-10 ENCOUNTER — Encounter (HOSPITAL_BASED_OUTPATIENT_CLINIC_OR_DEPARTMENT_OTHER): Payer: Self-pay | Admitting: Emergency Medicine

## 2023-11-10 ENCOUNTER — Ambulatory Visit (HOSPITAL_BASED_OUTPATIENT_CLINIC_OR_DEPARTMENT_OTHER)
Admission: EM | Admit: 2023-11-10 | Discharge: 2023-11-10 | Disposition: A | Discharge status: Home / Self Care | Attending: Family Medicine | Admitting: Family Medicine

## 2023-11-10 DIAGNOSIS — R112 Nausea with vomiting, unspecified: Secondary | ICD-10-CM | POA: Diagnosis not present

## 2023-11-10 DIAGNOSIS — R051 Acute cough: Secondary | ICD-10-CM

## 2023-11-10 LAB — POC COVID19/FLU A&B COMBO
Covid Antigen, POC: NEGATIVE
Influenza A Antigen, POC: NEGATIVE
Influenza B Antigen, POC: NEGATIVE

## 2023-11-10 MED ORDER — ONDANSETRON 4 MG PO TBDP: 4.0000 mg | ORAL_TABLET | Freq: Three times a day (TID) | ORAL | 0 refills | Status: AC | PRN

## 2023-11-10 NOTE — Discharge Instructions (Signed)
 Negative for flu and COVID.  Provided ondansetron, 4 mg melt on tongue ODT for nausea and vomiting, every 8 hours.  Provided information about nausea and vomiting.  Follow-up if symptoms do not improve, worsen or new symptoms occur.

## 2023-11-10 NOTE — ED Provider Notes (Signed)
 [facility]    CSN: [id number] Arrival date & time: 11/10/23  1140      History   Chief Complaint Chief Complaint  Patient presents with   Emesis    HPI Lisa Montoya is a 68 y.o. female.   Patient lives at the [place] group home and goes to a Monday through Friday activity center.  Today, 11/10/2023, she was at the activity center and she vomited some clear liquids and then put her head down over the trash can and stayed like that for some time.  She has a little bit of a cough it is very mild.  This morning she had no appetite and did not eat breakfast but did drink some coffee.  She is not very communicative and is developmentally delayed.  She is crying and upset because she had to leave the activity class.  She is here with her caregiver who was called to come get her from the class because she was sick.   Emesis Associated symptoms: arthralgias   Associated symptoms: no abdominal pain, no chills, no cough, no diarrhea, no fever and no sore throat     Past Medical History:  Diagnosis Date   Essential hypertension    Mental disability    Mixed hyperlipidemia    Morbid obesity due to excess calories (HCC)    Pneumonia due to SARS-associated coronavirus     Patient Active Problem List   Diagnosis Date Noted   Abnormal gait 09/08/2023   Fall 09/08/2023   Encounter for Medicare annual wellness exam 03/04/2023   Special needs due to language barrier 03/04/2023   Other constipation 05/26/2021   Falls frequently 02/25/2020   Class 2 severe obesity due to excess calories with serious comorbidity and body mass index (BMI) of 36.0 to 36.9 in adult [facility]) 02/25/2020   Mixed hyperlipidemia 01/16/2020   Elevated ferritin 01/16/2020   Intellectual disability 06/11/2019   Essential hypertension 06/11/2019    History reviewed. No pertinent surgical history.  OB History   No obstetric history on file.      Home Medications    Prior to Admission medications    Medication Sig Start Date End Date Taking? Authorizing Provider  ondansetron (ZOFRAN-ODT) 4 MG disintegrating tablet Take 1 tablet (4 mg total) by mouth every 8 (eight) hours as needed for nausea or vomiting. 11/10/23  Yes Prescilla Sours, FNP  aspirin EC 81 MG tablet Take 81 mg by mouth daily.    [provider]  atorvastatin (LIPITOR) 10 MG tablet TAKE 1 TABLET BY MOUTH ONCE DAILY 04/06/23   Cox, Kirsten, MD  bisoprolol-hydrochlorothiazide [facility]) 10-6.25 MG tablet TAKE 1/2 TABLET BY MOUTH ONCE DAILY 04/06/23   Cox, Kirsten, MD  Calcium Carbonate-Vit D-Min (CALTRATE 600+D PLUS MINERALS) 600-800 MG-UNIT TABS Take 1 tablet by mouth 2 (two) times daily. 04/12/23   Cox, Fritzi Mandes, MD  chlorhexidine (PERIDEX) 0.12 % solution SWISH & SPIT 1/2 OZ BY MOUTH FOR 30 SECONDS TWICE A DAY 09/26/23   Cox, Kirsten, MD  ciclopirox (PENLAC) 8 % solution APPLY OVER NAIL AND SURROUNDING SKIN DAILY AT BEDTIME. APPLY OVER PREVIOUS COATS. AFTER 7 DAYS, REMOVE WITH ALCOHOL AND CONTINUE CYCLE 07/25/23   Vivi Barrack, DPM  ibuprofen (ADVIL) 800 MG tablet Take 800 mg by mouth every 6 (six) hours as needed.    [provider]  Incontinence Supplies KIT 1 each by Does not apply route daily. Patient needs 3x urinary incontinence supplies 09/09/22   Janie Morning, NP  ketoconazole (NIZORAL) 2 % cream APPLY ONE APPLICATION TO THE AFFECTED AREA TOPICALLY ONCE DAILY 07/26/23   Louann Sjogren, DPM  neomycin-polymyxin b-dexamethasone (MAXITROL) 3.5-10000-0.1 SUSP Place 2 drops into the right eye every 6 (six) hours. 07/20/22   Janie Morning, NP  timolol (TIMOPTIC) 0.25 % ophthalmic solution INSTILL 1 DROP INTO BOTH EYES  TWICE A DAY AS INSTRUCTED 10/05/23   Cox, Kirsten, MD  urea (CARMOL) 10 % cream Apply topically at bedtime. Apply thin layer to skin on bottom of both feet at bedtime.  Standing order until patient re-evaluated in 3 months. 11/01/23   McCaughan, Dia D, DPM    Family History Family History  Problem  Relation Age of Onset   Breast cancer Neg Hx     Social History Social History   Tobacco Use   Smoking status: Never   Smokeless tobacco: Never  Substance Use Topics   Alcohol use: Never   Drug use: Never     Allergies   Patient has no known allergies.   Review of Systems Review of Systems  Constitutional:  Negative for chills and fever.  HENT:  Negative for ear pain and sore throat.   Eyes:  Negative for pain and visual disturbance.  Respiratory:  Negative for cough and shortness of breath.   Cardiovascular:  Negative for chest pain and palpitations.  Gastrointestinal:  Positive for nausea and vomiting. Negative for abdominal pain, constipation and diarrhea.  Genitourinary:  Negative for dysuria and hematuria.  Musculoskeletal:  Positive for arthralgias. Negative for back pain.  Skin:  Negative for color change and rash.  Neurological:  Positive for speech difficulty. Negative for seizures and syncope.  All other systems reviewed and are negative.    Physical Exam Triage Vital Signs ED Triage Vitals  Encounter Vitals Group     BP 11/10/23 1149 (!) 156/90     Systolic BP Percentile --      Diastolic BP Percentile --      Pulse Rate 11/10/23 1149 84     Resp 11/10/23 1149 18     Temp 11/10/23 1149 98.7 F (37.1 C)     Temp Source 11/10/23 1149 Oral     SpO2 11/10/23 1149 98 %     Weight --      Height --      Head Circumference --      Peak Flow --      Pain Score 11/10/23 1150 6     Pain Loc --      Pain Education --      Exclude from Growth Chart --    No data found.  Updated Vital Signs BP (!) 156/90 (BP Location: Right Arm)   Pulse 84   Temp 98.7 F (37.1 C) (Oral)   Resp 18   SpO2 98%   Visual Acuity Right Eye Distance:   Left Eye Distance:   Bilateral Distance:    Right Eye Near:   Left Eye Near:    Bilateral Near:     Physical Exam Vitals and nursing note reviewed.  Constitutional:      General: She is not in acute distress.     Appearance: She is well-developed. She is not ill-appearing or toxic-appearing.  HENT:     Head: Normocephalic and atraumatic.     Right Ear: Hearing, tympanic membrane, ear canal and external ear normal.     Left Ear: Hearing, tympanic membrane, ear canal and external ear normal.     Nose: No  congestion or rhinorrhea.     Right Sinus: No maxillary sinus tenderness or frontal sinus tenderness.     Left Sinus: No maxillary sinus tenderness or frontal sinus tenderness.     Mouth/Throat:     Lips: Pink.     Mouth: Mucous membranes are moist.     Pharynx: Uvula midline. No oropharyngeal exudate or posterior oropharyngeal erythema.     Tonsils: No tonsillar exudate.  Eyes:     Conjunctiva/sclera: Conjunctivae normal.     Pupils: Pupils are equal, round, and reactive to light.  Cardiovascular:     Rate and Rhythm: Normal rate and regular rhythm.     Heart sounds: S1 normal and S2 normal. No murmur heard. Pulmonary:     Effort: Pulmonary effort is normal. No respiratory distress.     Breath sounds: Normal breath sounds. No decreased breath sounds, wheezing, rhonchi or rales.  Abdominal:     General: Bowel sounds are normal.     Palpations: Abdomen is soft.     Tenderness: There is no abdominal tenderness.  Musculoskeletal:        General: No swelling.     Cervical back: Neck supple.  Lymphadenopathy:     Head:     Right side of head: No submental, submandibular, tonsillar, preauricular or posterior auricular adenopathy.     Left side of head: No submental, submandibular, tonsillar, preauricular or posterior auricular adenopathy.     Cervical: No cervical adenopathy.     Right cervical: No superficial cervical adenopathy.    Left cervical: No superficial cervical adenopathy.  Skin:    General: Skin is warm and dry.     Capillary Refill: Capillary refill takes less than 2 seconds.     Findings: No rash.  Neurological:     Mental Status: She is alert and oriented to person, place, and  time.  Psychiatric:        Mood and Affect: Mood normal. Affect is tearful.        Cognition and Memory: Cognition is impaired.     Comments: Patient is developmentally delayed and attends a day program several days a week.  She is tearful and upset because she was made to leave the program today due to vomiting.      UC Treatments / Results  Labs (all labs ordered are listed, but only abnormal results are displayed) Labs Reviewed  POC COVID19/FLU A&B COMBO - Normal    EKG   Radiology No results found.  Procedures Procedures (including critical care time)  Medications Ordered in UC Medications - No data to display  Initial Impression / Assessment and Plan / UC Course  I have reviewed the triage vital signs and the nursing notes.  Pertinent labs & imaging results that were available during my care of the patient were reviewed by me and considered in my medical decision making (see chart for details).     Negative for flu and COVID.  Ondansetron 4 mg ODT, melt on tongue, every 8 hours as needed for nausea and vomiting.  Follow-up if symptoms do not improve, worsen or new symptoms occur. Final Clinical Impressions(s) / UC Diagnoses   Final diagnoses:  Nausea and vomiting, unspecified vomiting type  Acute cough     Discharge Instructions      Negative for flu and COVID.  Provided ondansetron, 4 mg melt on tongue ODT for nausea and vomiting, every 8 hours.  Provided information about nausea and vomiting.  Follow-up if symptoms do not  improve, worsen or new symptoms occur.     ED Prescriptions     Medication Sig Dispense Auth. Provider   ondansetron (ZOFRAN-ODT) 4 MG disintegrating tablet Take 1 tablet (4 mg total) by mouth every 8 (eight) hours as needed for nausea or vomiting. 20 tablet Prescilla Sours, FNP      PDMP not reviewed this encounter.   Prescilla Sours, FNP 11/10/23 1247

## 2023-11-10 NOTE — ED Triage Notes (Signed)
 Pt c/o nausea and vomiting that started today.

## 2023-11-27 ENCOUNTER — Telehealth: Payer: Self-pay

## 2023-11-27 NOTE — Transitions of Care (Post Inpatient/ED Visit) (Unsigned)
   11/27/2023  Name: Lisa Montoya MRN: [id number] DOB: 1956-06-24  Today's TOC FU Call Status: Today's TOC FU Call Status:: Unsuccessful Call (1st Attempt) Unsuccessful Call (1st Attempt) Date: 11/27/23  Attempted to reach the patient regarding the most recent Inpatient/ED visit.  Follow Up Plan: Additional outreach attempts will be made to reach the patient to complete the Transitions of Care (Post Inpatient/ED visit) call.   Signature Karena Addison, LPN [facility] Nurse Health Advisor Direct Dial [phone]

## 2023-11-28 NOTE — Transitions of Care (Post Inpatient/ED Visit) (Unsigned)
   11/28/2023  Name: Lisa Montoya MRN: [id number] DOB: 1956-07-08  Today's TOC FU Call Status: Today's TOC FU Call Status:: Unsuccessful Call (2nd Attempt) Unsuccessful Call (1st Attempt) Date: 11/27/23 Unsuccessful Call (2nd Attempt) Date: 11/28/23  Attempted to reach the patient regarding the most recent Inpatient/ED visit.  Follow Up Plan: Additional outreach attempts will be made to reach the patient to complete the Transitions of Care (Post Inpatient/ED visit) call.   Signature Karena Addison, LPN [facility] Nurse Health Advisor Direct Dial [phone]

## 2023-11-29 NOTE — Transitions of Care (Post Inpatient/ED Visit) (Signed)
   11/29/2023  Name: Lisa Montoya MRN: [id number] DOB: 1955-09-19  Today's TOC FU Call Status: Today's TOC FU Call Status:: Successful TOC FU Call Completed Unsuccessful Call (1st Attempt) Date: 11/27/23 Unsuccessful Call (2nd Attempt) Date: 11/28/23 [facility] FU Call Complete Date: 11/29/23 Patient's Name and Date of Birth confirmed.  Transition Care Management Follow-up Telephone Call Date of Discharge: 11/24/23 Discharge Facility: Other ([facility]) Name of Other ([facility]) [facility]charge Facility: [facility] Type of Discharge: Inpatient Admission  Items Reviewed:    Medications Reviewed Today: Medications Reviewed Today   Medications were not reviewed in this encounter     Home Care and Equipment/Supplies:    Functional Questionnaire:    Follow up appointments reviewed: PCP Follow-up appointment confirmed?: No (see PCP in [facility])    SIGNATURE Karena Addison, LPN [facility] Nurse Health Advisor Direct Dial [phone]

## 2023-12-10 NOTE — Progress Notes (Unsigned)
 No show. Dr. Sedalia Muta

## 2023-12-11 ENCOUNTER — Ambulatory Visit: Payer: Medicare Other | Admitting: Family Medicine

## 2023-12-18 ENCOUNTER — Encounter: Payer: Self-pay | Admitting: Family Medicine

## 2024-01-31 ENCOUNTER — Ambulatory Visit: Payer: Medicare Other | Admitting: Podiatry

## 2024-02-29 ENCOUNTER — Encounter: Payer: Medicare Other | Admitting: Family Medicine
